# Patient Record
Sex: Male | Born: 1990
Health system: Southern US, Community
[De-identification: ages and names within clinical notes are randomized; demographics above are authoritative.]

## PROBLEM LIST (undated history)

## (undated) DIAGNOSIS — D66 Hereditary factor VIII deficiency: Secondary | ICD-10-CM

## (undated) DIAGNOSIS — Z862 Personal history of diseases of the blood and blood-forming organs and certain disorders involving the immune mechanism: Secondary | ICD-10-CM

## (undated) DIAGNOSIS — Z8489 Family history of other specified conditions: Secondary | ICD-10-CM

## (undated) DIAGNOSIS — D573 Sickle-cell trait: Secondary | ICD-10-CM

## (undated) DIAGNOSIS — Z9289 Personal history of other medical treatment: Secondary | ICD-10-CM

---
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
## 2016-08-15 ENCOUNTER — Emergency Department (HOSPITAL_COMMUNITY): Payer: No Typology Code available for payment source

## 2016-08-15 ENCOUNTER — Inpatient Hospital Stay (HOSPITAL_COMMUNITY)
Admission: EM | Admit: 2016-08-15 | Discharge: 2016-08-25 | DRG: 958 | Disposition: A | Discharge status: Home / Self Care | Payer: No Typology Code available for payment source | Attending: General Surgery | Admitting: General Surgery

## 2016-08-15 DIAGNOSIS — Z9289 Personal history of other medical treatment: Secondary | ICD-10-CM

## 2016-08-15 DIAGNOSIS — S27321A Contusion of lung, unilateral, initial encounter: Secondary | ICD-10-CM | POA: Diagnosis present

## 2016-08-15 DIAGNOSIS — T8130XA Disruption of wound, unspecified, initial encounter: Secondary | ICD-10-CM | POA: Diagnosis present

## 2016-08-15 DIAGNOSIS — Z09 Encounter for follow-up examination after completed treatment for conditions other than malignant neoplasm: Secondary | ICD-10-CM

## 2016-08-15 DIAGNOSIS — J939 Pneumothorax, unspecified: Secondary | ICD-10-CM | POA: Diagnosis present

## 2016-08-15 DIAGNOSIS — S31823A Puncture wound without foreign body of left buttock, initial encounter: Secondary | ICD-10-CM | POA: Diagnosis not present

## 2016-08-15 DIAGNOSIS — D62 Acute posthemorrhagic anemia: Secondary | ICD-10-CM | POA: Diagnosis not present

## 2016-08-15 DIAGNOSIS — S32302A Unspecified fracture of left ilium, initial encounter for closed fracture: Secondary | ICD-10-CM | POA: Diagnosis present

## 2016-08-15 DIAGNOSIS — R509 Fever, unspecified: Secondary | ICD-10-CM

## 2016-08-15 DIAGNOSIS — S31139A Puncture wound of abdominal wall without foreign body, unspecified quadrant without penetration into peritoneal cavity, initial encounter: Principal | ICD-10-CM | POA: Diagnosis present

## 2016-08-15 DIAGNOSIS — E876 Hypokalemia: Secondary | ICD-10-CM | POA: Diagnosis not present

## 2016-08-15 DIAGNOSIS — F419 Anxiety disorder, unspecified: Secondary | ICD-10-CM | POA: Diagnosis present

## 2016-08-15 DIAGNOSIS — S271XXA Traumatic hemothorax, initial encounter: Secondary | ICD-10-CM | POA: Diagnosis present

## 2016-08-15 DIAGNOSIS — W3400XA Accidental discharge from unspecified firearms or gun, initial encounter: Secondary | ICD-10-CM

## 2016-08-15 HISTORY — DX: Personal history of other medical treatment: Z92.89

## 2016-08-15 HISTORY — DX: Hereditary factor VIII deficiency: D66

## 2016-08-15 HISTORY — DX: Family history of other specified conditions: Z84.89

## 2016-08-15 HISTORY — DX: Sickle-cell trait: D57.3

## 2016-08-15 HISTORY — DX: Personal history of diseases of the blood and blood-forming organs and certain disorders involving the immune mechanism: Z86.2

## 2016-08-15 LAB — CBC
HCT: 36.4 % — ABNORMAL LOW (ref 39.0–52.0)
Hemoglobin: 12.3 g/dL — ABNORMAL LOW (ref 13.0–17.0)
MCH: 33.4 pg (ref 26.0–34.0)
MCHC: 33.8 g/dL (ref 30.0–36.0)
MCV: 98.9 fL (ref 78.0–100.0)
Platelets: 199 10*3/uL (ref 150–400)
RBC: 3.68 MIL/uL — AB (ref 4.22–5.81)
RDW: 13.3 % (ref 11.5–15.5)
WBC: 16.3 10*3/uL — AB (ref 4.0–10.5)

## 2016-08-15 LAB — PROTIME-INR
INR: 1
PROTHROMBIN TIME: 13.2 s (ref 11.4–15.2)

## 2016-08-15 LAB — COMPREHENSIVE METABOLIC PANEL
ALK PHOS: 39 U/L (ref 38–126)
ALT: 11 U/L — ABNORMAL LOW (ref 17–63)
AST: 20 U/L (ref 15–41)
Albumin: 3.3 g/dL — ABNORMAL LOW (ref 3.5–5.0)
Anion gap: 9 (ref 5–15)
BILIRUBIN TOTAL: 0.7 mg/dL (ref 0.3–1.2)
BUN: 13 mg/dL (ref 6–20)
CALCIUM: 8.2 mg/dL — AB (ref 8.9–10.3)
CO2: 20 mmol/L — ABNORMAL LOW (ref 22–32)
Chloride: 111 mmol/L (ref 101–111)
Creatinine, Ser: 1.22 mg/dL (ref 0.61–1.24)
GLUCOSE: 155 mg/dL — AB (ref 65–99)
POTASSIUM: 3.3 mmol/L — AB (ref 3.5–5.1)
Sodium: 140 mmol/L (ref 135–145)
TOTAL PROTEIN: 4.9 g/dL — AB (ref 6.5–8.1)

## 2016-08-15 LAB — I-STAT CG4 LACTIC ACID, ED: LACTIC ACID, VENOUS: 1.99 mmol/L — AB (ref 0.5–1.9)

## 2016-08-15 LAB — I-STAT CHEM 8, ED
BUN: 14 mg/dL (ref 6–20)
CHLORIDE: 110 mmol/L (ref 101–111)
CREATININE: 1.1 mg/dL (ref 0.61–1.24)
Calcium, Ion: 1.02 mmol/L — ABNORMAL LOW (ref 1.15–1.40)
GLUCOSE: 156 mg/dL — AB (ref 65–99)
HCT: 35 % — ABNORMAL LOW (ref 39.0–52.0)
Hemoglobin: 11.9 g/dL — ABNORMAL LOW (ref 13.0–17.0)
Potassium: 3 mmol/L — ABNORMAL LOW (ref 3.5–5.1)
Sodium: 143 mmol/L (ref 135–145)
TCO2: 22 mmol/L (ref 0–100)

## 2016-08-15 LAB — ETHANOL

## 2016-08-15 MED ORDER — FENTANYL CITRATE (PF) 100 MCG/2ML IJ SOLN
50.0000 ug | Freq: Once | INTRAMUSCULAR | Status: AC
  Administered 2016-08-15: 50 ug via INTRAVENOUS

## 2016-08-15 MED ORDER — IOPAMIDOL (ISOVUE-300) INJECTION 61%
INTRAVENOUS | Status: AC
  Filled 2016-08-15: qty 100

## 2016-08-15 NOTE — ED Notes (Signed)
Preparing to place L chest tube
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
## 2016-08-15 NOTE — ED Notes (Signed)
500cc EBL during chest tube placement

## 2016-08-16 ENCOUNTER — Inpatient Hospital Stay (HOSPITAL_COMMUNITY): Payer: No Typology Code available for payment source

## 2016-08-16 ENCOUNTER — Emergency Department (HOSPITAL_COMMUNITY): Payer: No Typology Code available for payment source | Admitting: Anesthesiology

## 2016-08-16 ENCOUNTER — Encounter (HOSPITAL_COMMUNITY): Admission: EM | Disposition: A | Discharge status: Home / Self Care | Payer: Self-pay | Source: Home / Self Care

## 2016-08-16 ENCOUNTER — Encounter (HOSPITAL_COMMUNITY): Payer: Self-pay | Admitting: Emergency Medicine

## 2016-08-16 DIAGNOSIS — T8130XA Disruption of wound, unspecified, initial encounter: Secondary | ICD-10-CM | POA: Diagnosis present

## 2016-08-16 DIAGNOSIS — S32302A Unspecified fracture of left ilium, initial encounter for closed fracture: Secondary | ICD-10-CM | POA: Diagnosis present

## 2016-08-16 DIAGNOSIS — S271XXA Traumatic hemothorax, initial encounter: Secondary | ICD-10-CM | POA: Diagnosis present

## 2016-08-16 DIAGNOSIS — S31139A Puncture wound of abdominal wall without foreign body, unspecified quadrant without penetration into peritoneal cavity, initial encounter: Secondary | ICD-10-CM | POA: Diagnosis present

## 2016-08-16 DIAGNOSIS — J939 Pneumothorax, unspecified: Secondary | ICD-10-CM | POA: Diagnosis present

## 2016-08-16 DIAGNOSIS — S31823A Puncture wound without foreign body of left buttock, initial encounter: Secondary | ICD-10-CM | POA: Diagnosis present

## 2016-08-16 DIAGNOSIS — F419 Anxiety disorder, unspecified: Secondary | ICD-10-CM | POA: Diagnosis present

## 2016-08-16 DIAGNOSIS — W3400XA Accidental discharge from unspecified firearms or gun, initial encounter: Secondary | ICD-10-CM

## 2016-08-16 DIAGNOSIS — E876 Hypokalemia: Secondary | ICD-10-CM | POA: Diagnosis not present

## 2016-08-16 DIAGNOSIS — D62 Acute posthemorrhagic anemia: Secondary | ICD-10-CM | POA: Diagnosis not present

## 2016-08-16 DIAGNOSIS — S27321A Contusion of lung, unilateral, initial encounter: Secondary | ICD-10-CM | POA: Diagnosis present

## 2016-08-16 HISTORY — PX: LAPAROTOMY: SHX154

## 2016-08-16 LAB — CBC
HCT: 31.3 % — ABNORMAL LOW (ref 39.0–52.0)
HEMATOCRIT: 37 % — AB (ref 39.0–52.0)
Hemoglobin: 12.3 g/dL — ABNORMAL LOW (ref 13.0–17.0)
Hemoglobin: 10.7 g/dL — ABNORMAL LOW (ref 13.0–17.0)
MCH: 32.8 pg (ref 26.0–34.0)
MCH: 33 pg (ref 26.0–34.0)
MCHC: 33.2 g/dL (ref 30.0–36.0)
MCHC: 34.2 g/dL (ref 30.0–36.0)
MCV: 98.7 fL (ref 78.0–100.0)
MCV: 96.6 fL (ref 78.0–100.0)
PLATELETS: 196 10*3/uL (ref 150–400)
Platelets: 207 10*3/uL (ref 150–400)
RBC: 3.75 MIL/uL — ABNORMAL LOW (ref 4.22–5.81)
RBC: 3.24 MIL/uL — ABNORMAL LOW (ref 4.22–5.81)
RDW: 13.5 % (ref 11.5–15.5)
RDW: 13.4 % (ref 11.5–15.5)
WBC: 16.9 10*3/uL — ABNORMAL HIGH (ref 4.0–10.5)
WBC: 21 10*3/uL — ABNORMAL HIGH (ref 4.0–10.5)

## 2016-08-16 LAB — BLOOD GAS, ARTERIAL
Acid-base deficit: 3.2 mmol/L — ABNORMAL HIGH (ref 0.0–2.0)
Bicarbonate: 21.4 mmol/L (ref 20.0–28.0)
Drawn by: 330991
FIO2: 40
LHR: 18 {breaths}/min
O2 Saturation: 97.1 %
PEEP: 5 cmH2O
Patient temperature: 98.6
VT: 570 mL
pCO2 arterial: 39.5 mmHg (ref 32.0–48.0)
pH, Arterial: 7.353 (ref 7.350–7.450)
pO2, Arterial: 98.6 mmHg (ref 83.0–108.0)

## 2016-08-16 LAB — URINALYSIS, ROUTINE W REFLEX MICROSCOPIC
BILIRUBIN URINE: NEGATIVE
GLUCOSE, UA: NEGATIVE mg/dL
HGB URINE DIPSTICK: NEGATIVE
Ketones, ur: NEGATIVE mg/dL
Leukocytes, UA: NEGATIVE
Nitrite: NEGATIVE
PH: 5 (ref 5.0–8.0)
Protein, ur: NEGATIVE mg/dL
SPECIFIC GRAVITY, URINE: 1.033 — AB (ref 1.005–1.030)

## 2016-08-16 LAB — PROTIME-INR
INR: 1.16
Prothrombin Time: 14.9 seconds (ref 11.4–15.2)

## 2016-08-16 LAB — BASIC METABOLIC PANEL
Anion gap: 5 (ref 5–15)
Anion gap: 5 (ref 5–15)
BUN: 12 mg/dL (ref 6–20)
BUN: 12 mg/dL (ref 6–20)
CALCIUM: 7.4 mg/dL — AB (ref 8.9–10.3)
CALCIUM: 7.6 mg/dL — AB (ref 8.9–10.3)
CO2: 21 mmol/L — AB (ref 22–32)
CO2: 23 mmol/L (ref 22–32)
CREATININE: 1.09 mg/dL (ref 0.61–1.24)
CREATININE: 1.03 mg/dL (ref 0.61–1.24)
Chloride: 114 mmol/L — ABNORMAL HIGH (ref 101–111)
Chloride: 111 mmol/L (ref 101–111)
GFR calc non Af Amer: >60 mL/min (ref >60)
GFR calc non Af Amer: >60 mL/min (ref >60)
GLUCOSE: 137 mg/dL — AB (ref 65–99)
Glucose, Bld: 185 mg/dL — ABNORMAL HIGH (ref 65–99)
Potassium: 3.6 mmol/L (ref 3.5–5.1)
Potassium: 4 mmol/L (ref 3.5–5.1)
SODIUM: 139 mmol/L (ref 135–145)
Sodium: 140 mmol/L (ref 135–145)

## 2016-08-16 LAB — BPAM FFP
BLOOD PRODUCT EXPIRATION DATE: 201806302359
ISSUE DATE / TIME: 201806102300
UNIT TYPE AND RH: 600

## 2016-08-16 LAB — POCT I-STAT 4, (NA,K, GLUC, HGB,HCT)
GLUCOSE: 110 mg/dL — AB (ref 65–99)
HCT: 29 % — ABNORMAL LOW (ref 39.0–52.0)
HEMOGLOBIN: 9.9 g/dL — AB (ref 13.0–17.0)
POTASSIUM: 3.7 mmol/L (ref 3.5–5.1)
Sodium: 146 mmol/L — ABNORMAL HIGH (ref 135–145)

## 2016-08-16 LAB — CDS SEROLOGY

## 2016-08-16 LAB — ABO/RH: ABO/RH(D): O POS

## 2016-08-16 LAB — TYPE AND SCREEN
ABO/RH(D): O POS
ANTIBODY SCREEN: NEGATIVE
UNIT DIVISION: 0
Unit division: 0

## 2016-08-16 LAB — BPAM RBC
BLOOD PRODUCT EXPIRATION DATE: 201807072359
Blood Product Expiration Date: 201807102359
ISSUE DATE / TIME: 201806102300
ISSUE DATE / TIME: 201806102300
Unit Type and Rh: 9500
Unit Type and Rh: 9500

## 2016-08-16 LAB — BLOOD PRODUCT ORDER (VERBAL) VERIFICATION

## 2016-08-16 LAB — PREPARE FRESH FROZEN PLASMA: UNIT DIVISION: 0

## 2016-08-16 LAB — HIV ANTIBODY (ROUTINE TESTING W REFLEX): HIV Screen 4th Generation wRfx: NONREACTIVE

## 2016-08-16 LAB — TRIGLYCERIDES: Triglycerides: 108 mg/dL (ref <150)

## 2016-08-16 LAB — MRSA PCR SCREENING: MRSA by PCR: NEGATIVE

## 2016-08-16 SURGERY — LAPAROTOMY, EXPLORATORY
Anesthesia: General | Site: Abdomen

## 2016-08-16 MED ORDER — LACTATED RINGERS IV SOLN
INTRAVENOUS | Status: DC | PRN
  Administered 2016-08-16 (×4): via INTRAVENOUS

## 2016-08-16 MED ORDER — DEXTROSE 5 % IV SOLN
2.0000 g | Freq: Once | INTRAVENOUS | Status: AC
  Administered 2016-08-16: 2 g via INTRAVENOUS
  Filled 2016-08-16: qty 2

## 2016-08-16 MED ORDER — ORAL CARE MOUTH RINSE
15.0000 mL | OROMUCOSAL | Status: DC
  Administered 2016-08-16 (×3): 15 mL via OROMUCOSAL

## 2016-08-16 MED ORDER — 0.9 % SODIUM CHLORIDE (POUR BTL) OPTIME
TOPICAL | Status: DC | PRN
  Administered 2016-08-16: 1000 mL

## 2016-08-16 MED ORDER — PROPOFOL 1000 MG/100ML IV EMUL
INTRAVENOUS | Status: AC
  Filled 2016-08-16: qty 100

## 2016-08-16 MED ORDER — HYDROMORPHONE HCL 1 MG/ML IJ SOLN
1.0000 mg | INTRAMUSCULAR | Status: DC | PRN
  Administered 2016-08-16 – 2016-08-20 (×17): 1 mg via INTRAVENOUS
  Filled 2016-08-16 (×18): qty 1

## 2016-08-16 MED ORDER — METHOCARBAMOL 1000 MG/10ML IJ SOLN
500.0000 mg | Freq: Three times a day (TID) | INTRAMUSCULAR | Status: DC | PRN
  Administered 2016-08-18 – 2016-08-20 (×4): 500 mg via INTRAVENOUS
  Filled 2016-08-16 (×5): qty 5

## 2016-08-16 MED ORDER — FENTANYL CITRATE (PF) 100 MCG/2ML IJ SOLN
INTRAMUSCULAR | Status: AC
  Filled 2016-08-16: qty 2

## 2016-08-16 MED ORDER — FENTANYL CITRATE (PF) 100 MCG/2ML IJ SOLN: 50.0000 ug | Freq: Once | INTRAMUSCULAR | Status: DC

## 2016-08-16 MED ORDER — SUCCINYLCHOLINE CHLORIDE 20 MG/ML IJ SOLN
INTRAMUSCULAR | Status: DC | PRN
  Administered 2016-08-16: 80 mg via INTRAVENOUS

## 2016-08-16 MED ORDER — FENTANYL 2500MCG IN NS 250ML (10MCG/ML) PREMIX INFUSION
25.0000 ug/h | INTRAVENOUS | Status: DC
  Administered 2016-08-16: 100 ug/h via INTRAVENOUS
  Administered 2016-08-16: 30 ug/h via INTRAVENOUS
  Filled 2016-08-16 (×3): qty 250

## 2016-08-16 MED ORDER — ACETAMINOPHEN 10 MG/ML IV SOLN
1000.0000 mg | Freq: Three times a day (TID) | INTRAVENOUS | Status: AC
  Administered 2016-08-16 – 2016-08-17 (×3): 1000 mg via INTRAVENOUS
  Filled 2016-08-16 (×3): qty 100

## 2016-08-16 MED ORDER — FENTANYL CITRATE (PF) 250 MCG/5ML IJ SOLN
INTRAMUSCULAR | Status: DC | PRN
  Administered 2016-08-16: 50 ug via INTRAVENOUS
  Administered 2016-08-16 (×2): 100 ug via INTRAVENOUS

## 2016-08-16 MED ORDER — ONDANSETRON HCL 4 MG PO TABS: 4.0000 mg | ORAL_TABLET | Freq: Four times a day (QID) | ORAL | Status: DC | PRN

## 2016-08-16 MED ORDER — PROPOFOL 1000 MG/100ML IV EMUL
0.0000 ug/kg/min | INTRAVENOUS | Status: DC
  Administered 2016-08-16: 15 ug/kg/min via INTRAVENOUS

## 2016-08-16 MED ORDER — PROPOFOL 500 MG/50ML IV EMUL
INTRAVENOUS | Status: DC | PRN
  Administered 2016-08-16: 50 ug/kg/min via INTRAVENOUS

## 2016-08-16 MED ORDER — ROCURONIUM BROMIDE 100 MG/10ML IV SOLN
INTRAVENOUS | Status: DC | PRN
  Administered 2016-08-16 (×2): 50 mg via INTRAVENOUS

## 2016-08-16 MED ORDER — PROPOFOL 10 MG/ML IV BOLUS
INTRAVENOUS | Status: DC | PRN
  Administered 2016-08-16: 120 mg via INTRAVENOUS

## 2016-08-16 MED ORDER — ONDANSETRON HCL 4 MG/2ML IJ SOLN
4.0000 mg | Freq: Four times a day (QID) | INTRAMUSCULAR | Status: DC | PRN
  Administered 2016-08-17: 4 mg via INTRAVENOUS
  Filled 2016-08-16: qty 2

## 2016-08-16 MED ORDER — FENTANYL CITRATE (PF) 250 MCG/5ML IJ SOLN
INTRAMUSCULAR | Status: AC
  Filled 2016-08-16: qty 5

## 2016-08-16 MED ORDER — KETOROLAC TROMETHAMINE 30 MG/ML IJ SOLN
30.0000 mg | Freq: Three times a day (TID) | INTRAMUSCULAR | Status: AC
  Administered 2016-08-16 – 2016-08-18 (×6): 30 mg via INTRAVENOUS
  Filled 2016-08-16 (×6): qty 1

## 2016-08-16 MED ORDER — FENTANYL BOLUS VIA INFUSION
50.0000 ug | INTRAVENOUS | Status: DC | PRN
  Filled 2016-08-16: qty 50

## 2016-08-16 MED ORDER — PANTOPRAZOLE SODIUM 40 MG IV SOLR
40.0000 mg | Freq: Every day | INTRAVENOUS | Status: DC
  Administered 2016-08-16 – 2016-08-20 (×5): 40 mg via INTRAVENOUS
  Filled 2016-08-16 (×5): qty 40

## 2016-08-16 MED ORDER — KCL IN DEXTROSE-NACL 20-5-0.45 MEQ/L-%-% IV SOLN
INTRAVENOUS | Status: DC
  Administered 2016-08-16 – 2016-08-19 (×8): via INTRAVENOUS
  Filled 2016-08-16 (×9): qty 1000

## 2016-08-16 MED ORDER — CHLORHEXIDINE GLUCONATE 0.12% ORAL RINSE (MEDLINE KIT)
15.0000 mL | Freq: Two times a day (BID) | OROMUCOSAL | Status: DC
  Administered 2016-08-16 – 2016-08-22 (×9): 15 mL via OROMUCOSAL

## 2016-08-16 MED ORDER — PANTOPRAZOLE SODIUM 40 MG PO TBEC
40.0000 mg | DELAYED_RELEASE_TABLET | Freq: Every day | ORAL | Status: DC
  Administered 2016-08-22 – 2016-08-24 (×3): 40 mg via ORAL
  Filled 2016-08-16 (×6): qty 1

## 2016-08-16 SURGICAL SUPPLY — 52 items
BLADE CLIPPER SURG (BLADE) ×3 IMPLANT
BNDG GAUZE ELAST 4 BULKY (GAUZE/BANDAGES/DRESSINGS) ×3 IMPLANT
CANISTER SUCT 3000ML PPV (MISCELLANEOUS) ×3 IMPLANT
CHLORAPREP W/TINT 26ML (MISCELLANEOUS) ×3 IMPLANT
COVER SURGICAL LIGHT HANDLE (MISCELLANEOUS) ×3 IMPLANT
DRAPE LAPAROSCOPIC ABDOMINAL (DRAPES) ×3 IMPLANT
DRAPE WARM FLUID 44X44 (DRAPE) ×3 IMPLANT
DRSG OPSITE POSTOP 4X10 (GAUZE/BANDAGES/DRESSINGS) IMPLANT
DRSG OPSITE POSTOP 4X8 (GAUZE/BANDAGES/DRESSINGS) IMPLANT
DRSG PAD ABDOMINAL 8X10 ST (GAUZE/BANDAGES/DRESSINGS) ×3 IMPLANT
ELECT BLADE 6.5 EXT (BLADE) IMPLANT
ELECT CAUTERY BLADE 6.4 (BLADE) ×3 IMPLANT
ELECT REM PT RETURN 9FT ADLT (ELECTROSURGICAL) ×3
ELECTRODE REM PT RTRN 9FT ADLT (ELECTROSURGICAL) ×1 IMPLANT
GLOVE BIO SURGEON STRL SZ8 (GLOVE) ×6 IMPLANT
GLOVE BIOGEL PI IND STRL 7.0 (GLOVE) ×1 IMPLANT
GLOVE BIOGEL PI IND STRL 8 (GLOVE) ×2 IMPLANT
GLOVE BIOGEL PI INDICATOR 7.0 (GLOVE) ×2
GLOVE BIOGEL PI INDICATOR 8 (GLOVE) ×4
GLOVE SURG SS PI 7.0 STRL IVOR (GLOVE) ×3 IMPLANT
GOWN STRL REUS W/ TWL LRG LVL3 (GOWN DISPOSABLE) ×2 IMPLANT
GOWN STRL REUS W/ TWL XL LVL3 (GOWN DISPOSABLE) ×1 IMPLANT
GOWN STRL REUS W/TWL LRG LVL3 (GOWN DISPOSABLE) ×4
GOWN STRL REUS W/TWL XL LVL3 (GOWN DISPOSABLE) ×2
KIT BASIN OR (CUSTOM PROCEDURE TRAY) ×3 IMPLANT
KIT ROOM TURNOVER OR (KITS) ×3 IMPLANT
LIGASURE IMPACT 36 18CM CVD LR (INSTRUMENTS) ×6 IMPLANT
NS IRRIG 1000ML POUR BTL (IV SOLUTION) ×6 IMPLANT
PACK GENERAL/GYN (CUSTOM PROCEDURE TRAY) ×3 IMPLANT
PAD ARMBOARD 7.5X6 YLW CONV (MISCELLANEOUS) ×6 IMPLANT
RELOAD PROXIMATE 30MM BLUE (ENDOMECHANICALS) ×3 IMPLANT
RELOAD PROXIMATE 75MM BLUE (ENDOMECHANICALS) ×6 IMPLANT
RELOAD STAPLER LINEAR PROX 30 (STAPLE) ×1 IMPLANT
SPECIMEN JAR LARGE (MISCELLANEOUS) ×3 IMPLANT
SPONGE LAP 18X18 X RAY DECT (DISPOSABLE) ×21 IMPLANT
STAPLER GUN LINEAR PROX 60 (STAPLE) ×3 IMPLANT
STAPLER PROXIMATE 75MM BLUE (STAPLE) ×6 IMPLANT
STAPLER RELOAD LINEAR PROX 30 (STAPLE) ×3
STAPLER VISISTAT 35W (STAPLE) ×3 IMPLANT
SUCTION POOLE TIP (SUCTIONS) ×3 IMPLANT
SUT PDS AB 1 TP1 96 (SUTURE) ×6 IMPLANT
SUT SILK 2 0 SH CR/8 (SUTURE) ×6 IMPLANT
SUT SILK 2 0 TIES 10X30 (SUTURE) ×3 IMPLANT
SUT SILK 3 0 SH CR/8 (SUTURE) ×6 IMPLANT
SUT SILK 3 0 TIES 10X30 (SUTURE) ×3 IMPLANT
SUT VIC AB 2-0 SH 18 (SUTURE) ×3 IMPLANT
SUT VIC AB 3-0 SH 18 (SUTURE) ×3 IMPLANT
TAPE CLOTH SURG 4X10 WHT LF (GAUZE/BANDAGES/DRESSINGS) ×3 IMPLANT
TOWEL GREEN STERILE (TOWEL DISPOSABLE) ×3 IMPLANT
TOWEL OR 17X26 10 PK STRL BLUE (TOWEL DISPOSABLE) ×3 IMPLANT
TRAY FOLEY W/METER SILVER 16FR (SET/KITS/TRAYS/PACK) ×3 IMPLANT
YANKAUER SUCT BULB TIP NO VENT (SUCTIONS) ×3 IMPLANT
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
## 2016-08-16 NOTE — Progress Notes (Signed)
Patient extubated and doing well,
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
## 2016-08-16 NOTE — Procedures (Signed)
Extubation Procedure Note  Patient Details:   Name: William Harrison DOB: June 06, 1990 MRN: [id number]   Airway Documentation:     Evaluation  O2 sats: stable throughout Complications: No apparent complications Patient did tolerate procedure well. Bilateral Breath Sounds: Clear   Yes   Patient extubated to 5L nasal cannula.  Positive cuff leak noted.  No evidence of stridor.  Patient able to speak post extubation.  Sats currently 95%.  Vitals are stable.  No complications noted.   William Harrison, [name] N 08/16/2016, 10:47 AM

## 2016-08-16 NOTE — Progress Notes (Signed)
Patient weaning, rate reduced on propofol and fentanyl. Patient taking good deep breaths. Very anxious for tube to be removed. RN has spoken with Dr. Lindie SpruceWyatt and he will be rounding on patient.

## 2016-08-16 NOTE — Transfer of Care (Signed)
Immediate Anesthesia Transfer of Care Note  Patient: William Harrison  Procedure(s) Performed: Procedure(s): EXPLORATORY LAPAROTOMY (N/A)  Patient Location: ICU  Anesthesia Type:General  Level of Consciousness: Patient remains intubated per anesthesia plan  Airway & Oxygen Therapy: Patient remains intubated per anesthesia plan and Patient placed on Ventilator (see vital sign flow sheet for setting)  Post-op Assessment: Report given to RN and Post -op Vital signs reviewed and stable  Post vital signs: Reviewed and stable  Last Vitals:  Vitals:   08/15/16 2313 08/15/16 2320  BP: (!) 130/53 131/66  Pulse: (!) 56 73  Resp: 16 (!) 24  Temp:      Last Pain:  Vitals:   08/15/16 2255  TempSrc: Tympanic         Complications: No apparent anesthesia complications
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
## 2016-08-16 NOTE — Anesthesia Preprocedure Evaluation (Addendum)
Anesthesia Evaluation  Patient identified by MRN, date of birth, ID band Patient awake    Reviewed: Allergy & Precautions, NPO status , Patient's Chart, lab work & pertinent test resultsPreop documentation limited or incomplete due to emergent nature of procedure.  Airway Mallampati: II  TM Distance: <3 FB Neck ROM: Full    Dental  (+) Teeth Intact, Dental Advisory Given   Pulmonary Current Smoker,     + decreased breath sounds      Cardiovascular  Rhythm:Regular Rate:Tachycardia     Neuro/Psych    GI/Hepatic   Endo/Other    Renal/GU      Musculoskeletal   Abdominal   Peds  Hematology  (+) Sickle cell trait ,   Anesthesia Other Findings   Reproductive/Obstetrics                            Anesthesia Physical Anesthesia Plan  ASA: II and emergent  Anesthesia Plan: General   Post-op Pain Management:    Induction: Intravenous, Rapid sequence and Cricoid pressure planned  PONV Risk Score and Plan: Treatment may vary due to age or medical condition  Airway Management Planned: Oral ETT  Additional Equipment:   Intra-op Plan:   Post-operative Plan: Post-operative intubation/ventilation  Informed Consent: I have reviewed the patients History and Physical, chart, labs and discussed the procedure including the risks, benefits and alternatives for the proposed anesthesia with the patient or authorized representative who has indicated his/her understanding and acceptance.   Only emergency history available and Dental advisory given  Plan Discussed with: CRNA, Anesthesiologist and Surgeon  Anesthesia Plan Comments:        Anesthesia Quick Evaluation

## 2016-08-16 NOTE — Anesthesia Procedure Notes (Signed)
Procedure Name: Intubation Date/Time: 08/16/2016 12:37 AM Performed by: Brien MatesMAHONY, Anatole Apollo D Pre-anesthesia Checklist: Patient identified, Emergency Drugs available, Suction available, Patient being monitored and Timeout performed Patient Re-evaluated:Patient Re-evaluated prior to inductionOxygen Delivery Method: Circle system utilized Preoxygenation: Pre-oxygenation with 100% oxygen Intubation Type: IV induction, Rapid sequence and Cricoid Pressure applied Laryngoscope Size: Miller and 2 Grade View: Grade I Tube type: Subglottic suction tube Tube size: 8.0 mm Number of attempts: 1 Airway Equipment and Method: Stylet Placement Confirmation: ETT inserted through vocal cords under direct vision,  positive ETCO2 and breath sounds checked- equal and bilateral Secured at: 22 cm Tube secured with: Tape Dental Injury: Teeth and Oropharynx as per pre-operative assessment
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
## 2016-08-17 ENCOUNTER — Inpatient Hospital Stay (HOSPITAL_COMMUNITY): Payer: No Typology Code available for payment source

## 2016-08-17 ENCOUNTER — Encounter (HOSPITAL_COMMUNITY): Payer: Self-pay

## 2016-08-17 LAB — BASIC METABOLIC PANEL
ANION GAP: 4 — AB (ref 5–15)
BUN: 7 mg/dL (ref 6–20)
CHLORIDE: 107 mmol/L (ref 101–111)
CO2: 24 mmol/L (ref 22–32)
Calcium: 8.1 mg/dL — ABNORMAL LOW (ref 8.9–10.3)
Creatinine, Ser: 0.87 mg/dL (ref 0.61–1.24)
GFR calc non Af Amer: >60 mL/min (ref >60)
Glucose, Bld: 114 mg/dL — ABNORMAL HIGH (ref 65–99)
POTASSIUM: 3.6 mmol/L (ref 3.5–5.1)
Sodium: 135 mmol/L (ref 135–145)

## 2016-08-17 LAB — CBC
HEMATOCRIT: 25.6 % — AB (ref 39.0–52.0)
HEMOGLOBIN: 9 g/dL — AB (ref 13.0–17.0)
MCH: 33.3 pg (ref 26.0–34.0)
MCHC: 35.2 g/dL (ref 30.0–36.0)
MCV: 94.8 fL (ref 78.0–100.0)
Platelets: 139 10*3/uL — ABNORMAL LOW (ref 150–400)
RBC: 2.7 MIL/uL — AB (ref 4.22–5.81)
RDW: 13.2 % (ref 11.5–15.5)
WBC: 14.2 10*3/uL — AB (ref 4.0–10.5)

## 2016-08-17 MED ORDER — SODIUM CHLORIDE 0.9 % IV SOLN
25.0000 mg | Freq: Four times a day (QID) | INTRAVENOUS | Status: DC | PRN
  Filled 2016-08-17 (×2): qty 1

## 2016-08-17 MED ORDER — LORAZEPAM BOLUS VIA INFUSION: 1.0000 mg | Freq: Four times a day (QID) | INTRAVENOUS | Status: DC | PRN

## 2016-08-17 MED ORDER — CHLORHEXIDINE GLUCONATE 0.12 % MT SOLN
OROMUCOSAL | Status: AC
  Filled 2016-08-17: qty 15

## 2016-08-17 MED ORDER — LORAZEPAM 2 MG/ML IJ SOLN: 1.0000 mg | Freq: Four times a day (QID) | INTRAMUSCULAR | Status: DC | PRN
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
## 2016-08-17 NOTE — Anesthesia Postprocedure Evaluation (Signed)
Anesthesia Post Note  Patient: William Harrison  Procedure(s) Performed: Procedure(s) (LRB): EXPLORATORY LAPAROTOMY (N/A)     Patient location during evaluation: SICU Anesthesia Type: General Level of consciousness: sedated Pain management: pain level controlled Vital Signs Assessment: post-procedure vital signs reviewed and stable Respiratory status: patient remains intubated per anesthesia plan Cardiovascular status: stable Anesthetic complications: no    Last Vitals:  Vitals:   08/17/16 0600 08/17/16 0700  BP: (!) 145/77 139/73  Pulse: 91 67  Resp: 18 14  Temp:  37.2 C    Last Pain:  Vitals:   08/17/16 0700  TempSrc: Oral  PainSc:                  Nylani Michetti
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
## 2016-08-17 NOTE — Progress Notes (Signed)
Patient arrived to 6n24, alert and oriented, NG tube placed to suction, chest tube noted in place and hooked to wall suction, IV fluids infusing, patient reported moderate pain will give medicine according to orders. VSS, not on any oxygen, has incentive spirometer at bedside. Set up password for patient and explained to him how the password works. Mother at bedside, oriented to room and staff, will continue to monitor.

## 2016-08-18 ENCOUNTER — Inpatient Hospital Stay (HOSPITAL_COMMUNITY): Payer: No Typology Code available for payment source | Admitting: Anesthesiology

## 2016-08-18 ENCOUNTER — Inpatient Hospital Stay (HOSPITAL_COMMUNITY): Payer: No Typology Code available for payment source

## 2016-08-18 ENCOUNTER — Encounter (HOSPITAL_COMMUNITY): Payer: Self-pay | Admitting: Anesthesiology

## 2016-08-18 ENCOUNTER — Encounter (HOSPITAL_COMMUNITY): Admission: EM | Disposition: A | Discharge status: Home / Self Care | Payer: Self-pay | Source: Home / Self Care

## 2016-08-18 HISTORY — PX: ABDOMINAL WOUND DEHISCENCE: SHX540

## 2016-08-18 LAB — BASIC METABOLIC PANEL
Anion gap: 7 (ref 5–15)
BUN: 6 mg/dL (ref 6–20)
CHLORIDE: 105 mmol/L (ref 101–111)
CO2: 24 mmol/L (ref 22–32)
Calcium: 7.6 mg/dL — ABNORMAL LOW (ref 8.9–10.3)
Creatinine, Ser: 0.98 mg/dL (ref 0.61–1.24)
GFR calc Af Amer: >60 mL/min (ref >60)
GLUCOSE: 109 mg/dL — AB (ref 65–99)
POTASSIUM: 3.5 mmol/L (ref 3.5–5.1)
SODIUM: 136 mmol/L (ref 135–145)

## 2016-08-18 LAB — CBC
HCT: 22.6 % — ABNORMAL LOW (ref 39.0–52.0)
Hemoglobin: 7.9 g/dL — ABNORMAL LOW (ref 13.0–17.0)
MCH: 33.2 pg (ref 26.0–34.0)
MCHC: 35 g/dL (ref 30.0–36.0)
MCV: 95 fL (ref 78.0–100.0)
Platelets: 149 10*3/uL — ABNORMAL LOW (ref 150–400)
RBC: 2.38 MIL/uL — ABNORMAL LOW (ref 4.22–5.81)
RDW: 13.2 % (ref 11.5–15.5)
WBC: 12.3 10*3/uL — ABNORMAL HIGH (ref 4.0–10.5)

## 2016-08-18 SURGERY — REPAIR, DEHISCENCE, WOUND, ABDOMEN
Anesthesia: General

## 2016-08-18 MED ORDER — DIPHENHYDRAMINE HCL 12.5 MG/5ML PO ELIX: 12.5000 mg | ORAL_SOLUTION | Freq: Four times a day (QID) | ORAL | Status: DC | PRN

## 2016-08-18 MED ORDER — HYDROMORPHONE HCL 1 MG/ML IJ SOLN
INTRAMUSCULAR | Status: AC
  Administered 2016-08-18: 0.5 mg via INTRAVENOUS
  Filled 2016-08-18: qty 1

## 2016-08-18 MED ORDER — HYDROMORPHONE 1 MG/ML IV SOLN
INTRAVENOUS | Status: DC
  Administered 2016-08-18: 16:00:00 via INTRAVENOUS
  Administered 2016-08-18: 0.6 mg via INTRAVENOUS
  Administered 2016-08-18: 0.8 mg via INTRAVENOUS
  Administered 2016-08-19: 0.6 mg via INTRAVENOUS
  Filled 2016-08-18 (×3): qty 25

## 2016-08-18 MED ORDER — MIDAZOLAM HCL 2 MG/2ML IJ SOLN: 0.5000 mg | Freq: Once | INTRAMUSCULAR | Status: DC | PRN

## 2016-08-18 MED ORDER — ONDANSETRON HCL 4 MG/2ML IJ SOLN
INTRAMUSCULAR | Status: DC | PRN
Start: 2016-08-18 — End: 2016-08-18
  Administered 2016-08-18: 4 mg via INTRAVENOUS

## 2016-08-18 MED ORDER — MEPERIDINE HCL 25 MG/ML IJ SOLN
6.2500 mg | INTRAMUSCULAR | Status: DC | PRN
Start: 2016-08-18 — End: 2016-08-18

## 2016-08-18 MED ORDER — HYDROMORPHONE HCL 1 MG/ML IJ SOLN
INTRAMUSCULAR | Status: AC
  Filled 2016-08-18: qty 1

## 2016-08-18 MED ORDER — LACTATED RINGERS IV SOLN
INTRAVENOUS | Status: DC
  Administered 2016-08-18: 14:00:00 via INTRAVENOUS

## 2016-08-18 MED ORDER — FENTANYL CITRATE (PF) 250 MCG/5ML IJ SOLN
INTRAMUSCULAR | Status: AC
  Filled 2016-08-18: qty 5

## 2016-08-18 MED ORDER — LIDOCAINE 2% (20 MG/ML) 5 ML SYRINGE
INTRAMUSCULAR | Status: AC
  Filled 2016-08-18: qty 5

## 2016-08-18 MED ORDER — DIPHENHYDRAMINE HCL 50 MG/ML IJ SOLN: 12.5000 mg | Freq: Four times a day (QID) | INTRAMUSCULAR | Status: DC | PRN

## 2016-08-18 MED ORDER — HYDROMORPHONE HCL 1 MG/ML IJ SOLN
0.2500 mg | INTRAMUSCULAR | Status: DC | PRN
  Administered 2016-08-18 (×3): 0.5 mg via INTRAVENOUS

## 2016-08-18 MED ORDER — ONDANSETRON HCL 4 MG/2ML IJ SOLN: 4.0000 mg | Freq: Four times a day (QID) | INTRAMUSCULAR | Status: DC | PRN

## 2016-08-18 MED ORDER — SUCCINYLCHOLINE CHLORIDE 20 MG/ML IJ SOLN
INTRAMUSCULAR | Status: DC | PRN
  Administered 2016-08-18: 140 mg via INTRAVENOUS

## 2016-08-18 MED ORDER — PHENYLEPHRINE 40 MCG/ML (10ML) SYRINGE FOR IV PUSH (FOR BLOOD PRESSURE SUPPORT)
PREFILLED_SYRINGE | INTRAVENOUS | Status: DC | PRN
  Administered 2016-08-18 (×2): 120 ug via INTRAVENOUS

## 2016-08-18 MED ORDER — SUGAMMADEX SODIUM 200 MG/2ML IV SOLN
INTRAVENOUS | Status: DC | PRN
  Administered 2016-08-18: 200 mg via INTRAVENOUS

## 2016-08-18 MED ORDER — HYDROMORPHONE HCL 1 MG/ML IJ SOLN
INTRAMUSCULAR | Status: AC
  Filled 2016-08-18: qty 0.5

## 2016-08-18 MED ORDER — ROCURONIUM BROMIDE 10 MG/ML (PF) SYRINGE
PREFILLED_SYRINGE | INTRAVENOUS | Status: AC
  Filled 2016-08-18: qty 5

## 2016-08-18 MED ORDER — SUCCINYLCHOLINE CHLORIDE 200 MG/10ML IV SOSY
PREFILLED_SYRINGE | INTRAVENOUS | Status: AC
  Filled 2016-08-18: qty 10

## 2016-08-18 MED ORDER — 0.9 % SODIUM CHLORIDE (POUR BTL) OPTIME
TOPICAL | Status: DC | PRN
  Administered 2016-08-18: 1000 mL

## 2016-08-18 MED ORDER — PROPOFOL 10 MG/ML IV BOLUS: INTRAVENOUS | Status: DC | PRN

## 2016-08-18 MED ORDER — ROCURONIUM BROMIDE 100 MG/10ML IV SOLN
INTRAVENOUS | Status: DC | PRN
  Administered 2016-08-18: 40 mg via INTRAVENOUS

## 2016-08-18 MED ORDER — MIDAZOLAM HCL 5 MG/5ML IJ SOLN
INTRAMUSCULAR | Status: DC | PRN
  Administered 2016-08-18: 2 mg via INTRAVENOUS

## 2016-08-18 MED ORDER — FENTANYL CITRATE (PF) 100 MCG/2ML IJ SOLN
INTRAMUSCULAR | Status: DC | PRN
  Administered 2016-08-18 (×2): 50 ug via INTRAVENOUS
  Administered 2016-08-18: 150 ug via INTRAVENOUS

## 2016-08-18 MED ORDER — PROPOFOL 10 MG/ML IV BOLUS
INTRAVENOUS | Status: AC
  Filled 2016-08-18: qty 20

## 2016-08-18 MED ORDER — SODIUM CHLORIDE 0.9% FLUSH
9.0000 mL | INTRAVENOUS | Status: DC | PRN
Start: 2016-08-18 — End: 2016-08-19

## 2016-08-18 MED ORDER — PROMETHAZINE HCL 25 MG/ML IJ SOLN: 6.2500 mg | INTRAMUSCULAR | Status: DC | PRN

## 2016-08-18 MED ORDER — NALOXONE HCL 0.4 MG/ML IJ SOLN: 0.4000 mg | INTRAMUSCULAR | Status: DC | PRN

## 2016-08-18 MED ORDER — PROPOFOL 10 MG/ML IV BOLUS
INTRAVENOUS | Status: DC | PRN
Start: 2016-08-18 — End: 2016-08-18
  Administered 2016-08-18: 200 mg via INTRAVENOUS

## 2016-08-18 MED ORDER — LACTATED RINGERS IV SOLN
INTRAVENOUS | Status: DC | PRN
  Administered 2016-08-18 (×2): via INTRAVENOUS

## 2016-08-18 MED ORDER — MIDAZOLAM HCL 2 MG/2ML IJ SOLN
INTRAMUSCULAR | Status: AC
  Filled 2016-08-18: qty 2

## 2016-08-18 SURGICAL SUPPLY — 31 items
BNDG GAUZE ELAST 4 BULKY (GAUZE/BANDAGES/DRESSINGS) ×3 IMPLANT
CANISTER SUCT 3000ML PPV (MISCELLANEOUS) ×3 IMPLANT
COVER SURGICAL LIGHT HANDLE (MISCELLANEOUS) ×3 IMPLANT
DRAIN PENROSE 1/4X12 LTX STRL (WOUND CARE) ×3 IMPLANT
DRAPE LAPAROSCOPIC ABDOMINAL (DRAPES) IMPLANT
DRAPE LAPAROTOMY 100X72 PEDS (DRAPES) IMPLANT
DRAPE UTILITY XL STRL (DRAPES) ×6 IMPLANT
DRSG PAD ABDOMINAL 8X10 ST (GAUZE/BANDAGES/DRESSINGS) ×6 IMPLANT
DRSG TEGADERM 4X4.75 (GAUZE/BANDAGES/DRESSINGS) ×6 IMPLANT
ELECT CAUTERY BLADE 6.4 (BLADE) ×3 IMPLANT
ELECT REM PT RETURN 9FT ADLT (ELECTROSURGICAL) ×3
ELECTRODE REM PT RTRN 9FT ADLT (ELECTROSURGICAL) ×1 IMPLANT
GAUZE SPONGE 4X4 12PLY STRL (GAUZE/BANDAGES/DRESSINGS) ×6 IMPLANT
GLOVE BIOGEL PI IND STRL 8 (GLOVE) ×1 IMPLANT
GLOVE BIOGEL PI INDICATOR 8 (GLOVE) ×2
GLOVE ECLIPSE 7.5 STRL STRAW (GLOVE) ×3 IMPLANT
GOWN STRL REUS W/ TWL LRG LVL3 (GOWN DISPOSABLE) ×2 IMPLANT
GOWN STRL REUS W/TWL LRG LVL3 (GOWN DISPOSABLE) ×4
KIT BASIN OR (CUSTOM PROCEDURE TRAY) ×3 IMPLANT
KIT ROOM TURNOVER OR (KITS) ×3 IMPLANT
NS IRRIG 1000ML POUR BTL (IV SOLUTION) ×3 IMPLANT
PACK GENERAL/GYN (CUSTOM PROCEDURE TRAY) ×3 IMPLANT
PAD ARMBOARD 7.5X6 YLW CONV (MISCELLANEOUS) ×3 IMPLANT
SUT ETHILON 4 0 FS 1 (SUTURE) ×6 IMPLANT
SUT NOVA 1 T20/GS 25DT (SUTURE) ×3 IMPLANT
SWAB COLLECTION DEVICE MRSA (MISCELLANEOUS) IMPLANT
SWAB CULTURE ESWAB REG 1ML (MISCELLANEOUS) IMPLANT
SYSTEM SAHARA CHEST DRAIN ATS (WOUND CARE) ×3 IMPLANT
TAPE CLOTH SURG 6X10 WHT LF (GAUZE/BANDAGES/DRESSINGS) ×3 IMPLANT
TOWEL OR 17X24 6PK STRL BLUE (TOWEL DISPOSABLE) ×3 IMPLANT
TOWEL OR 17X26 10 PK STRL BLUE (TOWEL DISPOSABLE) ×3 IMPLANT
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
## 2016-08-18 NOTE — Progress Notes (Signed)
PT Cancellation Note  Patient Details Name: William Harrison MRN: [id number] DOB: 1990-04-15   Cancelled Treatment:    Reason Eval/Treat Not Completed: (P) Patient at procedure or test/unavailable (Pt off unit for procedure, will defer tx at this time.  )   Florestine Aversimee J Ksenia Kunz 08/18/2016, 2:13 PM  Joycelyn RuaAimee Anessa Charley, PTA pager [phone]
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
## 2016-08-18 NOTE — Plan of Care (Signed)
Problem: Pain Management: Goal: Pain level will decrease Outcome: Progressing Pain being managed with IV PRN pain medication at this time. No facial grimacing or moaning evident. Vital signs are stable.
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
## 2016-08-18 NOTE — Plan of Care (Signed)
Problem: Safety: Goal: Ability to remain free from injury will improve Outcome: Progressing No falls during this admission. Call bell within reach. Patient alert and oriented. Family at bedside. Bed in low and locked position. 3/4 siderails in place. Nonskid footwear being utilized. Clean and clear environment maintained. Patient verbalized understanding of safety instruction.

## 2016-08-18 NOTE — Anesthesia Procedure Notes (Signed)
Procedure Name: Intubation Date/Time: 08/18/2016 2:33 PM Performed by: Teressa Lower Pre-anesthesia Checklist: Patient identified, Emergency Drugs available, Suction available and Patient being monitored Patient Re-evaluated:Patient Re-evaluated prior to inductionOxygen Delivery Method: Circle system utilized Preoxygenation: Pre-oxygenation with 100% oxygen Intubation Type: IV induction, Cricoid Pressure applied and Rapid sequence Laryngoscope Size: Mac Grade View: Grade I Tube type: Oral Tube size: 7.0 mm Number of attempts: 1 Airway Equipment and Method: Stylet and Oral airway Placement Confirmation: ETT inserted through vocal cords under direct vision,  positive ETCO2 and breath sounds checked- equal and bilateral Secured at: 23 cm Tube secured with: Tape Dental Injury: Teeth and Oropharynx as per pre-operative assessment
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
## 2016-08-18 NOTE — Progress Notes (Signed)
OT Cancellation Note  Patient Details Name: William Harrison MRN: [id number] DOB: 1990/09/19   Cancelled Treatment:    Reason Eval/Treat Not Completed: Patient at procedure or test/ unavailable. Currently in OR. Will follow up as time allows.  Gaye AlkenBailey A Tyana Butzer M.S., OTR/L Pager: [phone]  08/18/2016, 1:47 PM

## 2016-08-19 ENCOUNTER — Encounter (HOSPITAL_COMMUNITY): Payer: Self-pay | Admitting: General Surgery

## 2016-08-19 ENCOUNTER — Inpatient Hospital Stay (HOSPITAL_COMMUNITY): Payer: No Typology Code available for payment source

## 2016-08-19 LAB — CBC
HEMATOCRIT: 21.2 % — AB (ref 39.0–52.0)
Hemoglobin: 7.3 g/dL — ABNORMAL LOW (ref 13.0–17.0)
MCH: 32.6 pg (ref 26.0–34.0)
MCHC: 34.4 g/dL (ref 30.0–36.0)
MCV: 94.6 fL (ref 78.0–100.0)
Platelets: 170 10*3/uL (ref 150–400)
RBC: 2.24 MIL/uL — AB (ref 4.22–5.81)
RDW: 12.7 % (ref 11.5–15.5)
WBC: 11.3 10*3/uL — AB (ref 4.0–10.5)

## 2016-08-19 LAB — BASIC METABOLIC PANEL
ANION GAP: 7 (ref 5–15)
BUN: <5 mg/dL — ABNORMAL LOW (ref 6–20)
CHLORIDE: 104 mmol/L (ref 101–111)
CO2: 25 mmol/L (ref 22–32)
CREATININE: 0.89 mg/dL (ref 0.61–1.24)
Calcium: 7.9 mg/dL — ABNORMAL LOW (ref 8.9–10.3)
GFR calc Af Amer: >60 mL/min (ref >60)
GFR calc non Af Amer: >60 mL/min (ref >60)
GLUCOSE: 110 mg/dL — AB (ref 65–99)
Potassium: 3.4 mmol/L — ABNORMAL LOW (ref 3.5–5.1)
Sodium: 136 mmol/L (ref 135–145)

## 2016-08-19 MED ORDER — KCL IN DEXTROSE-NACL 30-5-0.45 MEQ/L-%-% IV SOLN
INTRAVENOUS | Status: DC
  Administered 2016-08-19: 100 mL/h via INTRAVENOUS
  Administered 2016-08-20 – 2016-08-21 (×3): via INTRAVENOUS
  Filled 2016-08-19 (×7): qty 1000

## 2016-08-19 MED ORDER — GUAIFENESIN ER 600 MG PO TB12
600.0000 mg | ORAL_TABLET | Freq: Two times a day (BID) | ORAL | Status: DC
  Administered 2016-08-19 – 2016-08-24 (×12): 600 mg via ORAL
  Filled 2016-08-19 (×13): qty 1

## 2016-08-19 MED ORDER — ACETAMINOPHEN 10 MG/ML IV SOLN
1000.0000 mg | Freq: Three times a day (TID) | INTRAVENOUS | Status: AC
  Administered 2016-08-19 – 2016-08-20 (×3): 1000 mg via INTRAVENOUS
  Filled 2016-08-19 (×3): qty 100
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
## 2016-08-19 NOTE — Progress Notes (Signed)
Abdominal dressing changed,patients mother at bedside to observe how to change dressing.NGT clamped x 4 hrs as ordered.Dilaudid PCA 20ml wasted in sink as witnessed by Teola BradleyZee Calwitan RN.
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
## 2016-08-19 NOTE — Progress Notes (Signed)
Chest tube to water seal per order.
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
## 2016-08-20 ENCOUNTER — Inpatient Hospital Stay (HOSPITAL_COMMUNITY): Payer: No Typology Code available for payment source

## 2016-08-20 LAB — CBC
HEMATOCRIT: 21.1 % — AB (ref 39.0–52.0)
Hemoglobin: 7.3 g/dL — ABNORMAL LOW (ref 13.0–17.0)
MCH: 32.6 pg (ref 26.0–34.0)
MCHC: 34.6 g/dL (ref 30.0–36.0)
MCV: 94.2 fL (ref 78.0–100.0)
PLATELETS: 231 10*3/uL (ref 150–400)
RBC: 2.24 MIL/uL — AB (ref 4.22–5.81)
RDW: 12.7 % (ref 11.5–15.5)
WBC: 10.1 10*3/uL (ref 4.0–10.5)

## 2016-08-20 LAB — BASIC METABOLIC PANEL
Anion gap: 7 (ref 5–15)
CO2: 24 mmol/L (ref 22–32)
Calcium: 8.2 mg/dL — ABNORMAL LOW (ref 8.9–10.3)
Chloride: 103 mmol/L (ref 101–111)
Creatinine, Ser: 0.82 mg/dL (ref 0.61–1.24)
GFR calc Af Amer: >60 mL/min (ref >60)
GLUCOSE: 102 mg/dL — AB (ref 65–99)
POTASSIUM: 3.6 mmol/L (ref 3.5–5.1)
Sodium: 134 mmol/L — ABNORMAL LOW (ref 135–145)

## 2016-08-20 MED ORDER — ACETAMINOPHEN 325 MG PO TABS: 650.0000 mg | ORAL_TABLET | Freq: Four times a day (QID) | ORAL | Status: DC

## 2016-08-20 MED ORDER — ACETAMINOPHEN 325 MG PO TABS
650.0000 mg | ORAL_TABLET | Freq: Four times a day (QID) | ORAL | Status: DC
  Administered 2016-08-20 – 2016-08-25 (×19): 650 mg via ORAL
  Filled 2016-08-20 (×20): qty 2

## 2016-08-20 MED ORDER — OXYCODONE HCL 5 MG PO TABS
5.0000 mg | ORAL_TABLET | ORAL | Status: DC | PRN
  Administered 2016-08-20 – 2016-08-24 (×13): 10 mg via ORAL
  Filled 2016-08-20 (×7): qty 2
  Filled 2016-08-20: qty 1
  Filled 2016-08-20: qty 2
  Filled 2016-08-20: qty 1
  Filled 2016-08-20 (×4): qty 2

## 2016-08-20 MED ORDER — HYDROMORPHONE HCL 1 MG/ML IJ SOLN: 1.0000 mg | INTRAMUSCULAR | Status: DC | PRN
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
## 2016-08-20 NOTE — Progress Notes (Signed)
NG tube  Clamped per MD order. No nausea or vomiting noted at this time.

## 2016-08-20 NOTE — Progress Notes (Signed)
NG tube connected to LIWS per order. No nausea and vomiting noted.
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
## 2016-08-20 NOTE — Progress Notes (Addendum)
NG tube connected to LIWS per order.No nausea or vomiting noted.Will continue to monitor.

## 2016-08-20 NOTE — Progress Notes (Signed)
NG tube clamped per MD order. No nausea or vomiting noted at this time.Will continue to monitor.

## 2016-08-21 ENCOUNTER — Inpatient Hospital Stay (HOSPITAL_COMMUNITY): Payer: No Typology Code available for payment source

## 2016-08-21 MED ORDER — BISACODYL 5 MG PO TBEC
10.0000 mg | DELAYED_RELEASE_TABLET | Freq: Once | ORAL | Status: AC
  Administered 2016-08-21: 10 mg via ORAL
  Filled 2016-08-21: qty 2

## 2016-08-21 NOTE — Progress Notes (Signed)
Patient's temperature 101.0 orally. Notified Dr Derrell Lollingamirez via text page. Derrell LollingRamirez returned call. Notified of patient's temp 101 and also earlier temp of 102. Ordered chest x-ray, blood cultures x2, and urine culture.
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
## 2016-08-22 LAB — CBC
HEMATOCRIT: 22.4 % — AB (ref 39.0–52.0)
Hemoglobin: 7.6 g/dL — ABNORMAL LOW (ref 13.0–17.0)
MCH: 31.9 pg (ref 26.0–34.0)
MCHC: 33.9 g/dL (ref 30.0–36.0)
MCV: 94.1 fL (ref 78.0–100.0)
Platelets: 345 10*3/uL (ref 150–400)
RBC: 2.38 MIL/uL — AB (ref 4.22–5.81)
RDW: 13 % (ref 11.5–15.5)
WBC: 10.2 10*3/uL (ref 4.0–10.5)

## 2016-08-22 LAB — BASIC METABOLIC PANEL
ANION GAP: 9 (ref 5–15)
BUN: <5 mg/dL — ABNORMAL LOW (ref 6–20)
CO2: 25 mmol/L (ref 22–32)
Calcium: 8.6 mg/dL — ABNORMAL LOW (ref 8.9–10.3)
Chloride: 99 mmol/L — ABNORMAL LOW (ref 101–111)
Creatinine, Ser: 0.95 mg/dL (ref 0.61–1.24)
GFR calc Af Amer: >60 mL/min (ref >60)
GFR calc non Af Amer: >60 mL/min (ref >60)
GLUCOSE: 104 mg/dL — AB (ref 65–99)
POTASSIUM: 3.7 mmol/L (ref 3.5–5.1)
Sodium: 133 mmol/L — ABNORMAL LOW (ref 135–145)

## 2016-08-22 MED ORDER — METHOCARBAMOL 500 MG PO TABS: 500.0000 mg | ORAL_TABLET | Freq: Four times a day (QID) | ORAL | Status: DC | PRN

## 2016-08-22 MED ORDER — HYDROMORPHONE HCL 1 MG/ML IJ SOLN: 1.0000 mg | INTRAMUSCULAR | Status: DC | PRN

## 2016-08-22 MED ORDER — CEFAZOLIN SODIUM-DEXTROSE 1-4 GM/50ML-% IV SOLN
1.0000 g | Freq: Three times a day (TID) | INTRAVENOUS | Status: DC
  Administered 2016-08-22 – 2016-08-24 (×6): 1 g via INTRAVENOUS
  Filled 2016-08-22 (×7): qty 50
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
## 2016-08-23 ENCOUNTER — Encounter (HOSPITAL_COMMUNITY): Payer: Self-pay | Admitting: *Deleted

## 2016-08-23 LAB — URINE CULTURE: CULTURE: NO GROWTH
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
## 2016-08-23 NOTE — Progress Notes (Signed)
Patient still complains of burning and pain in left upper arm. Site where IV was removed reddened and inflamed. Looks as if there might be an tiny open pus area where IV was inserted.

## 2016-08-24 ENCOUNTER — Encounter (HOSPITAL_COMMUNITY): Payer: Self-pay | Admitting: General Practice

## 2016-08-24 LAB — CBC
HEMATOCRIT: 25.8 % — AB (ref 39.0–52.0)
HEMOGLOBIN: 8.7 g/dL — AB (ref 13.0–17.0)
MCH: 31.9 pg (ref 26.0–34.0)
MCHC: 33.7 g/dL (ref 30.0–36.0)
MCV: 94.5 fL (ref 78.0–100.0)
Platelets: 543 10*3/uL — ABNORMAL HIGH (ref 150–400)
RBC: 2.73 MIL/uL — AB (ref 4.22–5.81)
RDW: 13 % (ref 11.5–15.5)
WBC: 11.5 10*3/uL — AB (ref 4.0–10.5)

## 2016-08-24 MED ORDER — ENOXAPARIN SODIUM 40 MG/0.4ML ~~LOC~~ SOLN: 40.0000 mg | SUBCUTANEOUS | Status: DC

## 2016-08-24 MED ORDER — VANCOMYCIN HCL 10 G IV SOLR
1250.0000 mg | Freq: Two times a day (BID) | INTRAVENOUS | Status: DC
  Administered 2016-08-24 (×2): 1250 mg via INTRAVENOUS
  Filled 2016-08-24 (×3): qty 1250

## 2016-08-24 MED ORDER — ENOXAPARIN SODIUM 40 MG/0.4ML ~~LOC~~ SOLN
40.0000 mg | SUBCUTANEOUS | Status: DC
  Filled 2016-08-24: qty 0.4

## 2016-08-24 MED ORDER — IBUPROFEN 400 MG PO TABS
400.0000 mg | ORAL_TABLET | Freq: Three times a day (TID) | ORAL | Status: DC
  Administered 2016-08-24 – 2016-08-25 (×3): 400 mg via ORAL
  Filled 2016-08-24 (×3): qty 1
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
## 2016-08-25 LAB — BASIC METABOLIC PANEL
ANION GAP: 7 (ref 5–15)
BUN: 9 mg/dL (ref 6–20)
CALCIUM: 9 mg/dL (ref 8.9–10.3)
CO2: 26 mmol/L (ref 22–32)
Chloride: 104 mmol/L (ref 101–111)
Creatinine, Ser: 0.81 mg/dL (ref 0.61–1.24)
Glucose, Bld: 90 mg/dL (ref 65–99)
Potassium: 3.9 mmol/L (ref 3.5–5.1)
Sodium: 137 mmol/L (ref 135–145)

## 2016-08-25 MED ORDER — OXYCODONE HCL 5 MG PO TABS: 5.0000 mg | ORAL_TABLET | ORAL | 0 refills | Status: DC | PRN

## 2016-08-25 MED ORDER — IBUPROFEN 400 MG PO TABS: 400.0000 mg | ORAL_TABLET | Freq: Three times a day (TID) | ORAL | 0 refills | Status: DC | PRN

## 2016-08-25 MED ORDER — ACETAMINOPHEN 325 MG PO TABS: 650.0000 mg | ORAL_TABLET | Freq: Four times a day (QID) | ORAL | Status: DC | PRN

## 2016-08-25 MED ORDER — SULFAMETHOXAZOLE-TRIMETHOPRIM 800-160 MG PO TABS: 1.0000 | ORAL_TABLET | Freq: Two times a day (BID) | ORAL | 1 refills | Status: DC

## 2016-08-25 NOTE — Progress Notes (Signed)
Discharged patient to home. Verbalized understanding of all written and verbal discharge instructions to include, follow up appointments, how to take care of wounds (supplies given:4 abd pads, 1 box 4x4 gauze, 1 kerlix roll, and 1 medipore tape), and new prescriptions given. Left unit via wheelchair, accompanied by Mother and volunteer transportation

## 2016-08-25 NOTE — Progress Notes (Addendum)
Removed IV site at RFA due to patient's insistence to have it removed since he does not want the same experience with the former IV site at his LAC despite this RN explaining to patient that he has still IV antibiotic scheduled at 1000 tomorrow. Patient wanted oral antibiotic tomorrow and told this RN that he will be going home tomorrow anyway with or without MD order.
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
## 2016-08-25 NOTE — Discharge Instructions (Signed)
MIDLINE WOUND CARE: - midline dressing to be changed twice daily - supplies: sterile saline, kerlix, scissors, ABD pads, tape  - remove dressing and all packing carefully, moistening with sterile saline as needed to avoid packing/internal dressing sticking to the wound. - clean edges of skin around the wound with water/gauze, making sure there is no tape debris or leakage left on skin that could cause skin irritation or breakdown. - dampen and clean kerlix with sterile saline and pack wound from wound base to skin level, making sure to take note of any possible areas of wound tracking, tunneling and packing appropriately. Wound can be packed loosely. Trim kerlix to size if a whole kerlix is not required. - cover wound with a dry ABD pad and secure with tape.  - write the date/time on the dry dressing/tape to better track when the last dressing change occurred. - apply any skin protectant/powder recommended by clinician to protect skin/skin folds. - change dressing as needed if leakage occurs, wound gets contaminated, or patient requests to shower. - patient may shower daily with wound open and following the shower the wound should be dried and a clean dressing placed.   [facility], [place] [phone]  OPEN ABDOMINAL SURGERY: POST OP INSTRUCTIONS  Always review your discharge instruction sheet given to you by the facility where your surgery was performed.  IF YOU HAVE DISABILITY OR FAMILY LEAVE FORMS, YOU MUST BRING THEM TO THE OFFICE FOR PROCESSING.  PLEASE DO NOT GIVE THEM TO YOUR DOCTOR.  1. A prescription for pain medication may be given to you upon discharge.  Take your pain medication as prescribed, if needed.  If narcotic pain medicine is not needed, then you may take acetaminophen (Tylenol) or ibuprofen (Advil) as needed. 2. Take your usually prescribed medications unless otherwise directed. 3. If you need a refill on your pain medication, please contact your  pharmacy. They will contact our office to request authorization.  Prescriptions will not be filled after 5pm or on week-ends. 4. You should follow a light diet the first few days after arrival home, such as soup and crackers, pudding, etc.unless your doctor has advised otherwise. A high-fiber, low fat diet can be resumed as tolerated.   Be sure to include lots of fluids daily. Most patients will experience some swelling and bruising on the chest and neck area.  Ice packs will help.  Swelling and bruising can take several days to resolve 5. Most patients will experience some swelling and bruising in the area of the incision. Ice pack will help. Swelling and bruising can take several days to resolve..  6. It is common to experience some constipation if taking pain medication after surgery.  Increasing fluid intake and taking a stool softener will usually help or prevent this problem from occurring.  A mild laxative (Milk of Magnesia or Miralax) should be taken according to package directions if there are no bowel movements after 48 hours. 7.  You may have steri-strips (small skin tapes) in place directly over the incision.  These strips should be left on the skin for 7-10 days.  If your surgeon used skin glue on the incision, you may shower in 24 hours.  The glue will flake off over the next 2-3 weeks.  Any sutures or staples will be removed at the office during your follow-up visit. You may find that a light gauze bandage over your incision may keep your staples from being rubbed or pulled.  You may shower and replace the bandage daily. 8. ACTIVITIES:  You may resume regular (light) daily activities beginning the next day--such as daily self-care, walking, climbing stairs--gradually increasing activities as tolerated.  You may have sexual intercourse when it is comfortable.  Refrain from any heavy lifting or straining until approved by your doctor. a. You may drive when you no longer are taking prescription pain  medication, you can comfortably wear a seatbelt, and you can safely maneuver your car and apply brakes b. Return to Work: ___________________________________ 9. You should see your doctor in the office for a follow-up appointment approximately two weeks after your surgery.  Make sure that you call for this appointment within a day or two after you arrive home to insure a convenient appointment time. OTHER INSTRUCTIONS:  _____________________________________________________________ _____________________________________________________________  WHEN TO CALL YOUR DOCTOR: 1. Fever over 101.0 2. Inability to urinate 3. Nausea and/or vomiting 4. Extreme swelling or bruising 5. Continued bleeding from incision. 6. Increased pain, redness, or drainage from the incision. 7. Difficulty swallowing or breathing 8. Muscle cramping or spasms. 9. Numbness or tingling in hands or feet or around lips.  The clinic staff is available to answer your questions during regular business hours.  Please dont hesitate to call and ask to speak to one of the nurses if you have concerns.  For further questions, please visit [email or web address]   Phlebitis Phlebitis is soreness and puffiness (swelling) in a vein. Follow these instructions at home:  Only take medicine as told by your doctor.  Raise (elevate) the affected limb on a pillow as told by your doctor.  Keep a warm pack on the affected vein as told by your doctor. Do not sleep with a heating pad.  Use special stockings or bandages around the area of the affected vein as told by your doctor. These will speed healing and keep the condition from coming back.  Talk to your doctor about all the medicines you take.  If the phlebitis is in your legs: ? Avoid standing or resting for long periods. ? Keep your legs moving. Raise your legs when you sit or lie.  Do not smoke.  Follow-up with your doctor as told. Contact a doctor if:  You have  strange bruises or bleeding.  Your puffiness or pain in the affected area is not getting better.  You are taking medicine to lessen puffiness (anti-inflammatory medicine), and you get belly pain.  You have a fever. Get help right away if:  The phlebitis gets worse and you have more pain, puffiness (swelling), or redness.  You have trouble breathing or have chest pain. This information is not intended to replace advice given to you by your health care provider. Make sure you discuss any questions you have with your health care provider. Document Released: 02/10/2009 Document Revised: 07/31/2015 Document Reviewed: 10/30/2012 Elsevier Interactive Patient Education  2017 [employer]

## 2016-08-27 LAB — CULTURE, BLOOD (ROUTINE X 2)
CULTURE: NO GROWTH
CULTURE: NO GROWTH
SPECIAL REQUESTS: ADEQUATE
Special Requests: ADEQUATE

## 2016-09-01 ENCOUNTER — Telehealth (HOSPITAL_COMMUNITY): Payer: Self-pay

## 2016-09-01 NOTE — Telephone Encounter (Signed)
[phone] [facility].  She said he really does not need home care but does need help with wound care supplies.  Question if we could help

## 2016-09-01 NOTE — Telephone Encounter (Signed)
[facility] Patient and mother have been able to do wound care on their own and patient is not home-bound, therefore not meeting [facility]. Patient needs wound care supplies, explained that generally patients have to buy their own supplies if not receiving HH. Discussed that there [place] multiple medical supply stores [place].   Wells GuilesKelly Rayburn , [facility] 09/01/2016, 1:30 PM Pager: [phone] Mon-Fri 7:00 am-4:30 pm Sat-Sun 7:00 am-11:30 am

## 2016-09-02 ENCOUNTER — Ambulatory Visit
Admission: RE | Admit: 2016-09-02 | Discharge: 2016-09-02 | Disposition: A | Discharge status: Home / Self Care | Payer: Medicaid Other | Source: Ambulatory Visit | Attending: Physician Assistant | Admitting: Physician Assistant

## 2016-09-02 ENCOUNTER — Other Ambulatory Visit: Payer: Self-pay | Admitting: Physician Assistant

## 2016-09-02 DIAGNOSIS — J939 Pneumothorax, unspecified: Secondary | ICD-10-CM

## 2017-10-27 ENCOUNTER — Emergency Department (HOSPITAL_BASED_OUTPATIENT_CLINIC_OR_DEPARTMENT_OTHER)
Admission: EM | Admit: 2017-10-27 | Discharge: 2017-10-27 | Disposition: A | Discharge status: Home / Self Care | Payer: Self-pay | Attending: Emergency Medicine | Admitting: Emergency Medicine

## 2017-10-27 ENCOUNTER — Other Ambulatory Visit: Payer: Self-pay

## 2017-10-27 ENCOUNTER — Encounter (HOSPITAL_BASED_OUTPATIENT_CLINIC_OR_DEPARTMENT_OTHER): Payer: Self-pay | Admitting: Emergency Medicine

## 2017-10-27 DIAGNOSIS — D573 Sickle-cell trait: Secondary | ICD-10-CM | POA: Insufficient documentation

## 2017-10-27 DIAGNOSIS — Z202 Contact with and (suspected) exposure to infections with a predominantly sexual mode of transmission: Secondary | ICD-10-CM | POA: Insufficient documentation

## 2017-10-27 DIAGNOSIS — F17290 Nicotine dependence, other tobacco product, uncomplicated: Secondary | ICD-10-CM | POA: Insufficient documentation

## 2017-10-27 LAB — URINALYSIS, ROUTINE W REFLEX MICROSCOPIC
BILIRUBIN URINE: NEGATIVE
Glucose, UA: NEGATIVE mg/dL
Hgb urine dipstick: NEGATIVE
KETONES UR: NEGATIVE mg/dL
LEUKOCYTES UA: NEGATIVE
NITRITE: NEGATIVE
PROTEIN: 30 mg/dL — AB
Specific Gravity, Urine: >1.03 — ABNORMAL HIGH (ref 1.005–1.030)
pH: 5.5 (ref 5.0–8.0)

## 2017-10-27 LAB — URINALYSIS, MICROSCOPIC (REFLEX)

## 2017-10-27 MED ORDER — METRONIDAZOLE 500 MG PO TABS
2000.0000 mg | ORAL_TABLET | Freq: Once | ORAL | Status: AC
  Administered 2017-10-27: 2000 mg via ORAL
  Filled 2017-10-27: qty 4

## 2017-10-27 NOTE — ED Triage Notes (Signed)
Patient presents requesting STD check; states he may have been exposed to STD. Denies any urinary sx or penile discharge.
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
## 2017-10-28 LAB — GC/CHLAMYDIA PROBE AMP (~~LOC~~) NOT AT ARMC
CHLAMYDIA, DNA PROBE: POSITIVE — AB
NEISSERIA GONORRHEA: NEGATIVE

## 2018-12-18 ENCOUNTER — Encounter (HOSPITAL_BASED_OUTPATIENT_CLINIC_OR_DEPARTMENT_OTHER): Payer: Self-pay

## 2018-12-18 ENCOUNTER — Other Ambulatory Visit: Payer: Self-pay

## 2018-12-18 ENCOUNTER — Emergency Department (HOSPITAL_BASED_OUTPATIENT_CLINIC_OR_DEPARTMENT_OTHER)
Admission: EM | Admit: 2018-12-18 | Discharge: 2018-12-18 | Disposition: A | Discharge status: Home / Self Care | Payer: Self-pay | Attending: Emergency Medicine | Admitting: Emergency Medicine

## 2018-12-18 DIAGNOSIS — Z5321 Procedure and treatment not carried out due to patient leaving prior to being seen by health care provider: Secondary | ICD-10-CM | POA: Insufficient documentation

## 2018-12-18 NOTE — ED Triage Notes (Signed)
Pt c/o rash to groin x 1-2 months-NAD-steady gait
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
## 2018-12-18 NOTE — ED Notes (Signed)
Pt has not returned to ED 

## 2018-12-28 ENCOUNTER — Other Ambulatory Visit: Payer: Self-pay

## 2018-12-28 ENCOUNTER — Encounter (HOSPITAL_BASED_OUTPATIENT_CLINIC_OR_DEPARTMENT_OTHER): Payer: Self-pay | Admitting: *Deleted

## 2018-12-28 ENCOUNTER — Emergency Department (HOSPITAL_BASED_OUTPATIENT_CLINIC_OR_DEPARTMENT_OTHER)
Admission: EM | Admit: 2018-12-28 | Discharge: 2018-12-28 | Disposition: A | Discharge status: Home / Self Care | Payer: Self-pay | Attending: Emergency Medicine | Admitting: Emergency Medicine

## 2018-12-28 DIAGNOSIS — F17210 Nicotine dependence, cigarettes, uncomplicated: Secondary | ICD-10-CM | POA: Insufficient documentation

## 2018-12-28 DIAGNOSIS — Z880 Allergy status to penicillin: Secondary | ICD-10-CM | POA: Insufficient documentation

## 2018-12-28 DIAGNOSIS — Z91018 Allergy to other foods: Secondary | ICD-10-CM | POA: Insufficient documentation

## 2018-12-28 DIAGNOSIS — R21 Rash and other nonspecific skin eruption: Secondary | ICD-10-CM | POA: Insufficient documentation
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
## 2018-12-28 NOTE — ED Triage Notes (Signed)
Flaky skin on thigh comes and goes  Denies burning w urination  No penile discharge

## 2018-12-28 NOTE — Discharge Instructions (Signed)
APPLY POWDER SUCH AS GOLD BOND OR MONKEY BUTT TO GROIN AREA BEFORE ANY SWEATY ACTIVITIES SUCH AS EXERCISE OR WORKING IN THE HEAT. DRY OFF GROIN AREA WELL AFTER SHOWERING. CHANGE UNDERWEAR FREQUENTLY IF SWEATY.

## 2018-12-29 LAB — GC/CHLAMYDIA PROBE AMP (~~LOC~~) NOT AT ARMC
Chlamydia: NEGATIVE
Neisseria Gonorrhea: NEGATIVE

## 2019-09-15 ENCOUNTER — Emergency Department (HOSPITAL_BASED_OUTPATIENT_CLINIC_OR_DEPARTMENT_OTHER): Payer: Self-pay

## 2019-09-15 ENCOUNTER — Emergency Department (HOSPITAL_BASED_OUTPATIENT_CLINIC_OR_DEPARTMENT_OTHER)
Admission: EM | Admit: 2019-09-15 | Discharge: 2019-09-16 | Disposition: A | Discharge status: Home / Self Care | Payer: Self-pay | Attending: Emergency Medicine | Admitting: Emergency Medicine

## 2019-09-15 ENCOUNTER — Encounter (HOSPITAL_BASED_OUTPATIENT_CLINIC_OR_DEPARTMENT_OTHER): Payer: Self-pay | Admitting: Emergency Medicine

## 2019-09-15 ENCOUNTER — Other Ambulatory Visit: Payer: Self-pay

## 2019-09-15 DIAGNOSIS — Y939 Activity, unspecified: Secondary | ICD-10-CM | POA: Insufficient documentation

## 2019-09-15 DIAGNOSIS — Y999 Unspecified external cause status: Secondary | ICD-10-CM | POA: Insufficient documentation

## 2019-09-15 DIAGNOSIS — S62024A Nondisplaced fracture of middle third of navicular [scaphoid] bone of right wrist, initial encounter for closed fracture: Secondary | ICD-10-CM | POA: Insufficient documentation

## 2019-09-15 DIAGNOSIS — F17290 Nicotine dependence, other tobacco product, uncomplicated: Secondary | ICD-10-CM | POA: Insufficient documentation

## 2019-09-15 DIAGNOSIS — Y9269 Other specified industrial and construction area as the place of occurrence of the external cause: Secondary | ICD-10-CM | POA: Insufficient documentation

## 2019-09-15 DIAGNOSIS — W1830XA Fall on same level, unspecified, initial encounter: Secondary | ICD-10-CM | POA: Insufficient documentation

## 2019-09-15 DIAGNOSIS — F1290 Cannabis use, unspecified, uncomplicated: Secondary | ICD-10-CM | POA: Insufficient documentation

## 2019-09-15 DIAGNOSIS — Y990 Civilian activity done for income or pay: Secondary | ICD-10-CM | POA: Insufficient documentation
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
## 2019-09-15 NOTE — ED Triage Notes (Signed)
Patient states that he hurt his right wrist and hand on Thursday - noted right wrist and hand swelling

## 2019-09-16 MED ORDER — HYDROCODONE-ACETAMINOPHEN 5-325 MG PO TABS
1.0000 | ORAL_TABLET | Freq: Once | ORAL | Status: AC
  Administered 2019-09-16: 1 via ORAL
  Filled 2019-09-16: qty 1

## 2019-09-16 MED ORDER — HYDROCODONE-ACETAMINOPHEN 5-325 MG PO TABS: 1.0000 | ORAL_TABLET | ORAL | 0 refills | Status: DC | PRN

## 2019-09-16 NOTE — ED Notes (Signed)
Ice pack and splint instructions provided to pt. Voiced understanding.

## 2019-09-16 NOTE — ED Notes (Signed)
Pt states he has a ride home

## 2019-09-16 NOTE — Discharge Instructions (Addendum)
Keep your splint on, keep it clean and dry. No lifting, gripping, grasping with the right hand greater than paper/pencil tasks. Work as I showed you with active assisted range of motion to preserve digital motion.

## 2019-11-20 ENCOUNTER — Emergency Department (HOSPITAL_BASED_OUTPATIENT_CLINIC_OR_DEPARTMENT_OTHER): Payer: Self-pay

## 2019-11-20 ENCOUNTER — Emergency Department (HOSPITAL_BASED_OUTPATIENT_CLINIC_OR_DEPARTMENT_OTHER)
Admission: EM | Admit: 2019-11-20 | Discharge: 2019-11-20 | Disposition: A | Discharge status: Home / Self Care | Payer: Self-pay | Attending: Emergency Medicine | Admitting: Emergency Medicine

## 2019-11-20 ENCOUNTER — Other Ambulatory Visit: Payer: Self-pay

## 2019-11-20 ENCOUNTER — Encounter (HOSPITAL_BASED_OUTPATIENT_CLINIC_OR_DEPARTMENT_OTHER): Payer: Self-pay

## 2019-11-20 DIAGNOSIS — Z79899 Other long term (current) drug therapy: Secondary | ICD-10-CM | POA: Insufficient documentation

## 2019-11-20 DIAGNOSIS — F17290 Nicotine dependence, other tobacco product, uncomplicated: Secondary | ICD-10-CM | POA: Insufficient documentation

## 2019-11-20 DIAGNOSIS — L03317 Cellulitis of buttock: Secondary | ICD-10-CM | POA: Insufficient documentation

## 2019-11-20 LAB — COMPREHENSIVE METABOLIC PANEL
ALT: 17 U/L (ref 0–44)
AST: 20 U/L (ref 15–41)
Albumin: 4.2 g/dL (ref 3.5–5.0)
Alkaline Phosphatase: 67 U/L (ref 38–126)
Anion gap: 11 (ref 5–15)
BUN: 9 mg/dL (ref 6–20)
CO2: 27 mmol/L (ref 22–32)
Calcium: 9.1 mg/dL (ref 8.9–10.3)
Chloride: 98 mmol/L (ref 98–111)
Creatinine, Ser: 0.87 mg/dL (ref 0.61–1.24)
GFR calc Af Amer: >60 mL/min (ref >60)
GFR calc non Af Amer: >60 mL/min (ref >60)
Glucose, Bld: 95 mg/dL (ref 70–99)
Potassium: 3.9 mmol/L (ref 3.5–5.1)
Sodium: 136 mmol/L (ref 135–145)
Total Bilirubin: 1.5 mg/dL — ABNORMAL HIGH (ref 0.3–1.2)
Total Protein: 8.2 g/dL — ABNORMAL HIGH (ref 6.5–8.1)

## 2019-11-20 LAB — CBC WITH DIFFERENTIAL/PLATELET
Abs Immature Granulocytes: 0.12 10*3/uL — ABNORMAL HIGH (ref 0.00–0.07)
Basophils Absolute: 0.1 10*3/uL (ref 0.0–0.1)
Basophils Relative: 0 %
Eosinophils Absolute: 0.1 10*3/uL (ref 0.0–0.5)
Eosinophils Relative: 1 %
HCT: 44.9 % (ref 39.0–52.0)
Hemoglobin: 15.8 g/dL (ref 13.0–17.0)
Immature Granulocytes: 1 %
Lymphocytes Relative: 10 %
Lymphs Abs: 2 10*3/uL (ref 0.7–4.0)
MCH: 34.3 pg — ABNORMAL HIGH (ref 26.0–34.0)
MCHC: 35.2 g/dL (ref 30.0–36.0)
MCV: 97.6 fL (ref 80.0–100.0)
Monocytes Absolute: 1.5 10*3/uL — ABNORMAL HIGH (ref 0.1–1.0)
Monocytes Relative: 8 %
Neutro Abs: 15.8 10*3/uL — ABNORMAL HIGH (ref 1.7–7.7)
Neutrophils Relative %: 80 %
Platelets: 203 10*3/uL (ref 150–400)
RBC: 4.6 MIL/uL (ref 4.22–5.81)
RDW: 12.6 % (ref 11.5–15.5)
WBC: 19.7 10*3/uL — ABNORMAL HIGH (ref 4.0–10.5)
nRBC: 0 % (ref 0.0–0.2)

## 2019-11-20 MED ORDER — DOXYCYCLINE HYCLATE 100 MG PO CAPS: 100.0000 mg | ORAL_CAPSULE | Freq: Two times a day (BID) | ORAL | 0 refills | Status: AC

## 2019-11-20 MED ORDER — IOHEXOL 300 MG/ML  SOLN
100.0000 mL | Freq: Once | INTRAMUSCULAR | Status: AC | PRN
  Administered 2019-11-20: 100 mL via INTRAVENOUS

## 2019-11-20 MED ORDER — OXYCODONE-ACETAMINOPHEN 5-325 MG PO TABS
1.0000 | ORAL_TABLET | Freq: Once | ORAL | Status: AC
  Administered 2019-11-20: 1 via ORAL
  Filled 2019-11-20: qty 1

## 2019-11-20 MED ORDER — DOXYCYCLINE HYCLATE 100 MG PO TABS
100.0000 mg | ORAL_TABLET | Freq: Once | ORAL | Status: AC
  Administered 2019-11-20: 100 mg via ORAL
  Filled 2019-11-20: qty 1

## 2019-11-20 MED FILL — DOXYCYCLINE HYCLATE 100 MG: 100 | 7 days supply | Qty: 14 | Fill #0

## 2019-11-20 NOTE — ED Notes (Signed)
Patient transported to CT 

## 2019-11-20 NOTE — Discharge Instructions (Signed)
Please read instructions below. Take the antibiotic, doxycycline, every 12 hours until gone. Keep your wound clean and covered. Soak/flush your wound with warm water, multiple times per day. You can take Advil/ibuprofen every 6 hours as needed for pain.  Follow up with urgent care or the ED for wound recheck in 2-3 days.  Return to the ER for fever, worsening redness/pain, or new or worsening symptoms.
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
## 2019-11-20 NOTE — ED Triage Notes (Signed)
Pt arrives with c/o sore to thigh/buttocks on left side starting yesterday. Pt reports from yesterday to today the area has gotten much larger and painful.

## 2021-04-30 IMAGING — CT CT ABD-PELV W/ CM
2 of 4 series · 16 of 46 positions shown, 18 images · IV contrast (Omnipaque)
Comparison: None

CLINICAL DATA: Abdominal anscess and infection suspected,
perirectal abscess, LEFT thigh and buttock pain

EXAM:
CT ABDOMEN AND PELVIS WITH CONTRAST
TECHNIQUE: Multidetector CT imaging of the abdomen and pelvis was performed
using the standard protocol following bolus administration of
intravenous contrast. Sagittal and coronal MPR images reconstructed
from axial data set.
CONTRAST:  100mL OMNIPAQUE IOHEXOL 300 MG/ML SOLN IV. No oral
contrast administered.

[Series 3: axial st · axial · 0.74mm/px · z∈[-691,-236]mm · 13 of 101 slices shown, 15 images]
[im 5/101  soft-tissue]
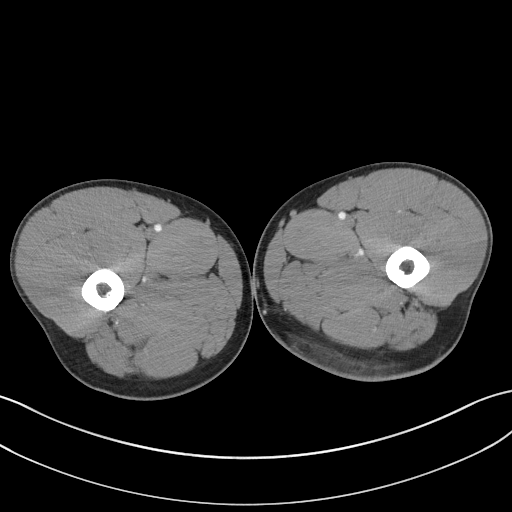
[im 5/101  bone]
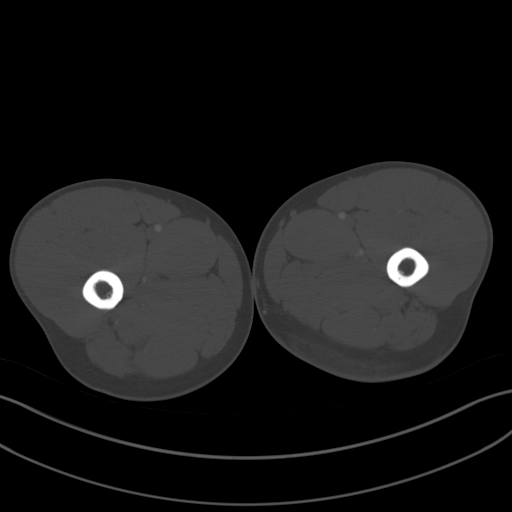
[im 13/101  soft-tissue]
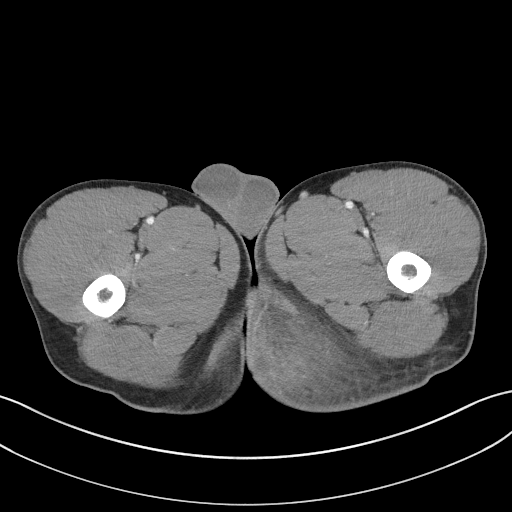
[im 21/101  soft-tissue]
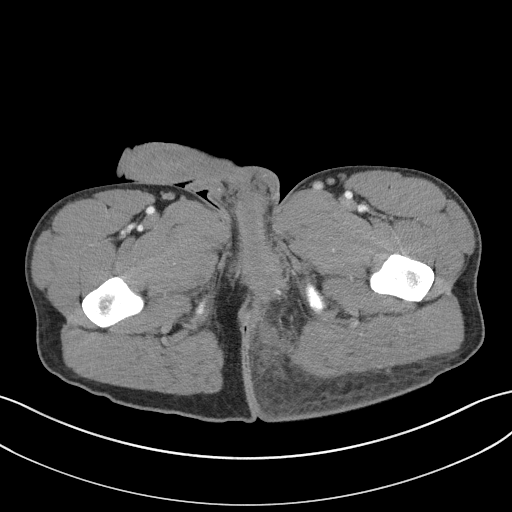
[im 30/101  soft-tissue]
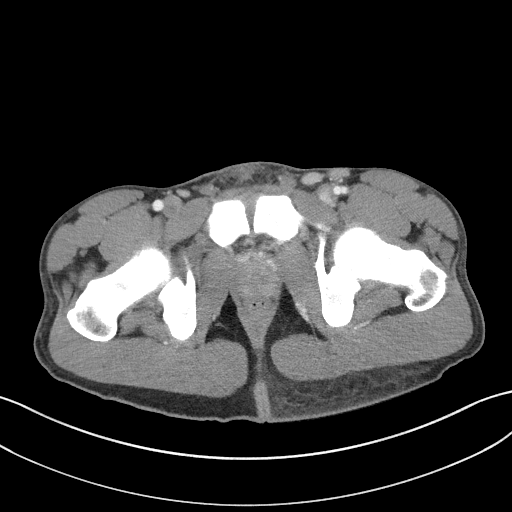
[im 34/101  soft-tissue]
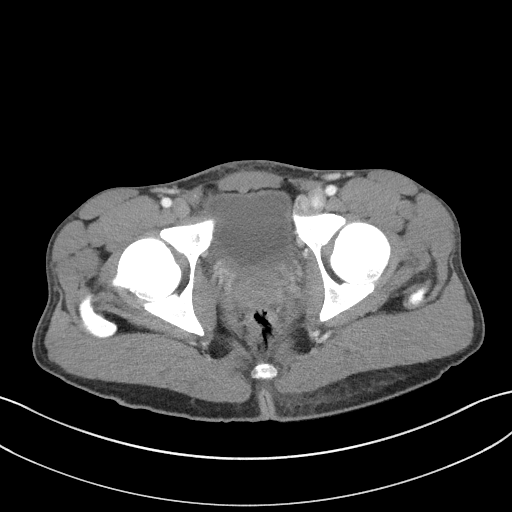
[im 42/101  soft-tissue]
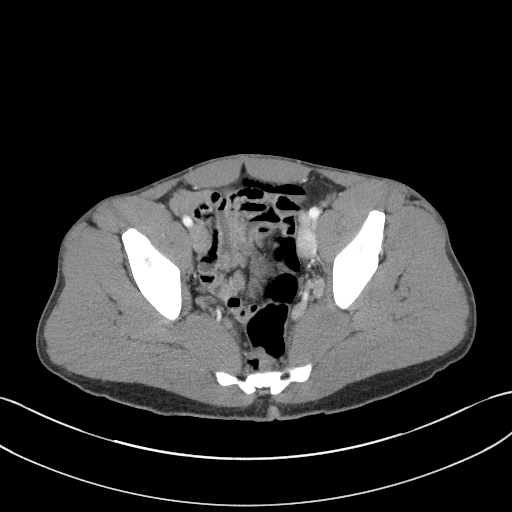
[im 51/101  soft-tissue]
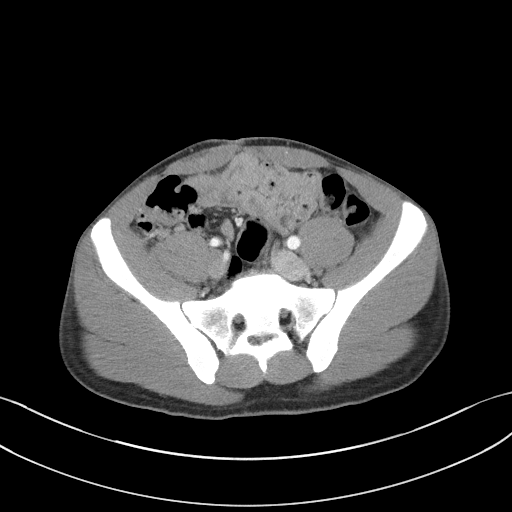
[im 59/101  soft-tissue]
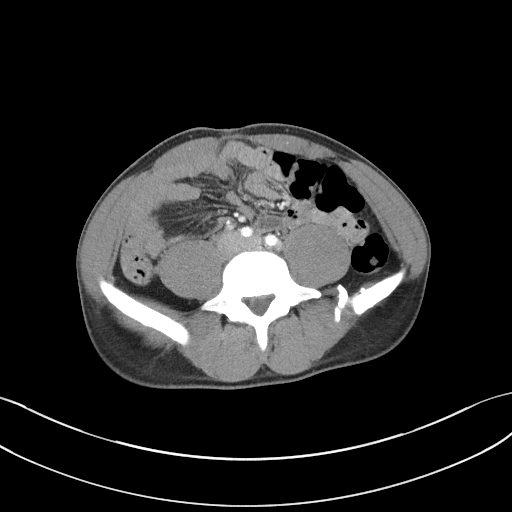
[im 67/101  soft-tissue]
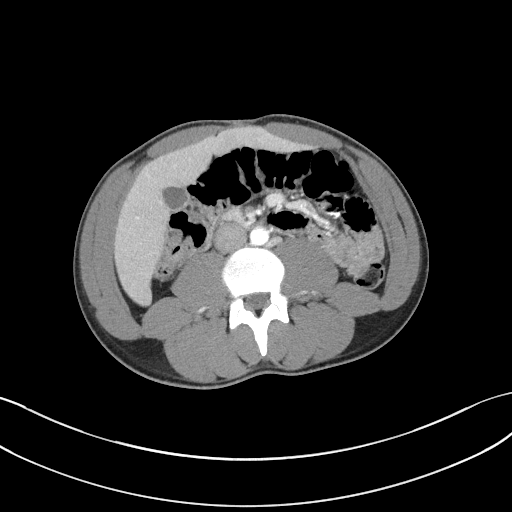
[im 67/101  bone]
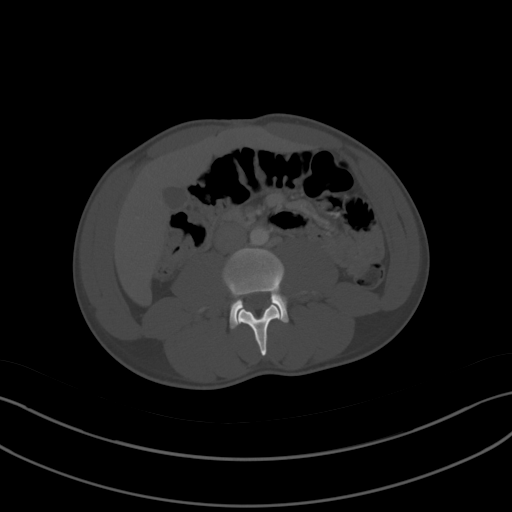
[im 71/101  soft-tissue]
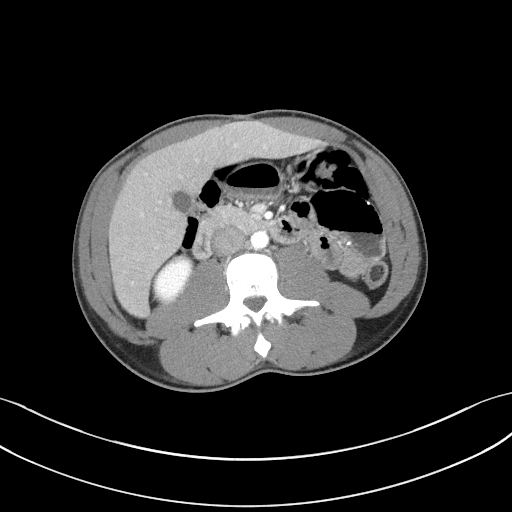
[im 80/101  soft-tissue]
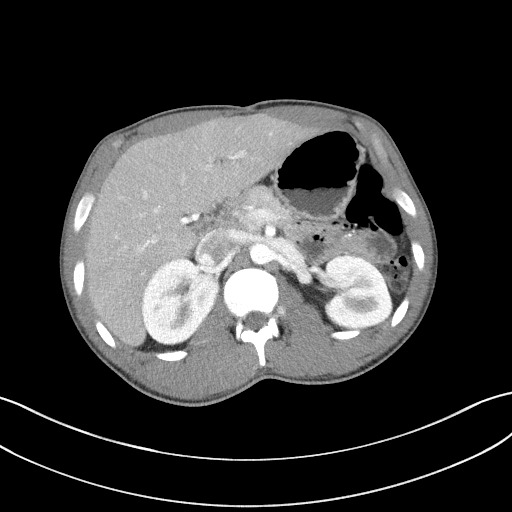
[im 88/101  soft-tissue]
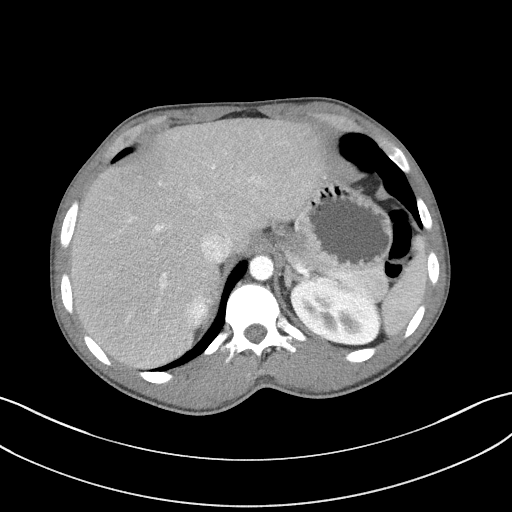
[im 96/101  soft-tissue]
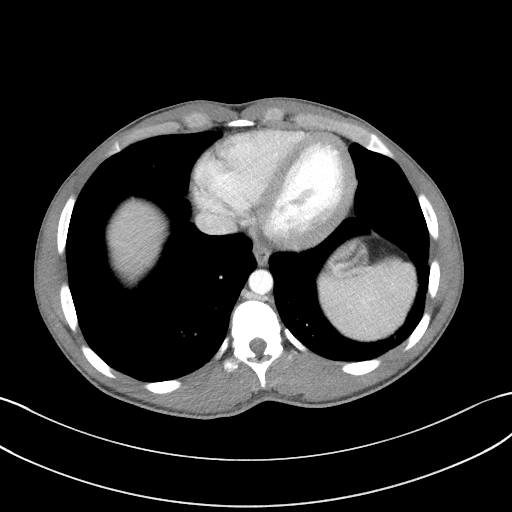

[Series 5: coronal st · coronal · 0.76mm/px · 3 of 70 slices shown]
[im 24/70  soft-tissue]
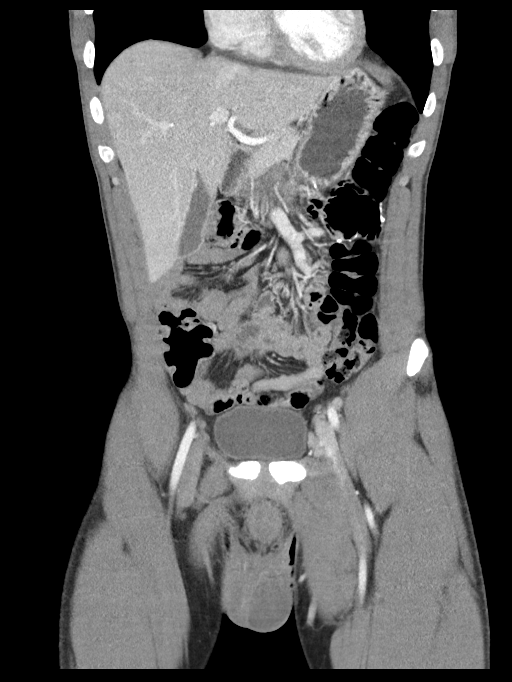
[im 31/70  soft-tissue]
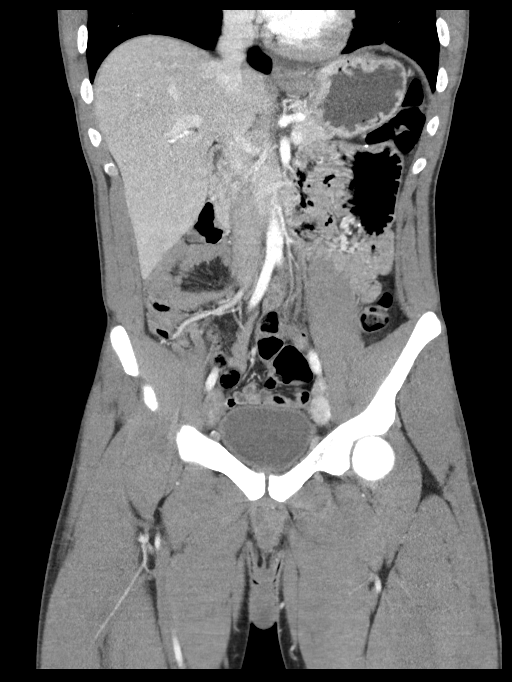
[im 39/70  soft-tissue]
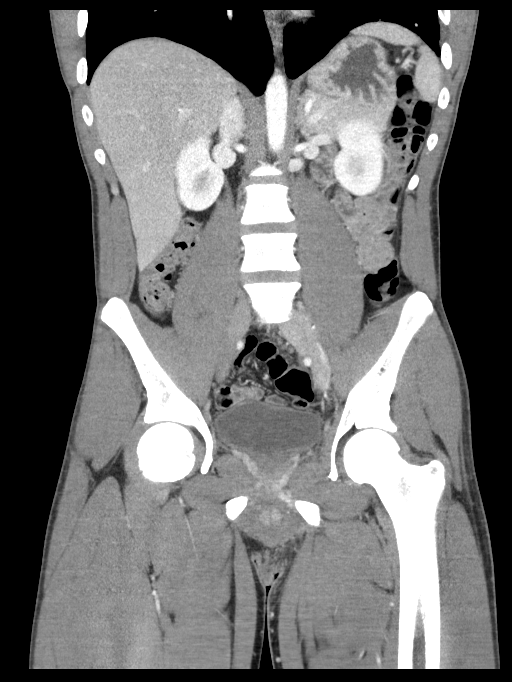

[16 of 46 positions shown; findings below may reference images not displayed]

FINDINGS: Lower chest: LEFT basilar atelectasis

Hepatobiliary: Gallbladder and liver normal appearance

Pancreas: Normal appearance

Spleen: Normal appearance

Adrenals/Urinary Tract: Adrenal glands, kidneys, ureters, and
bladder normal appearance.

Stomach/Bowel: Normal appendix. Stomach and bowel loops normal
appearance

Vascular/Lymphatic: Vascular structures patent.  No adenopathy.

Reproductive: Unremarkable

Other: No free air or free fluid. Diffuse infiltrative changes LEFT
buttock medially consistent with cellulitis. Questionable developing
focal fluid collection 2 cm diameter versus edema, not yet a
marginated abscess collection. No soft tissue gas. Soft tissue edema
extends towards the perineum but does not extend intrapelvic.

Musculoskeletal: Unremarkable
IMPRESSION: Diffuse infiltrative changes of the LEFT buttock medially consistent
with cellulitis.

Questionable developing focal fluid collection 2 cm diameter versus
edema, not yet a defined abscess collection.

No soft tissue gas identified.

Remainder of exam unremarkable.

## 2024-03-05 ENCOUNTER — Ambulatory Visit: Payer: Self-pay | Admitting: Surgery

## 2024-03-13 ENCOUNTER — Encounter (HOSPITAL_BASED_OUTPATIENT_CLINIC_OR_DEPARTMENT_OTHER): Payer: Self-pay | Admitting: Surgery

## 2024-03-13 ENCOUNTER — Other Ambulatory Visit: Payer: Self-pay

## 2024-03-19 NOTE — Anesthesia Preprocedure Evaluation (Signed)
"                                    Anesthesia Evaluation  Patient identified by MRN, date of birth, ID band Patient awake    Reviewed: Allergy & Precautions, NPO status , Patient's Chart, lab work & pertinent test results  Airway Mallampati: II  TM Distance: >3 FB Neck ROM: Full    Dental no notable dental hx. (+) Teeth Intact, Dental Advisory Given   Pulmonary neg pulmonary ROS, Current SmokerPatient did not abstain from smoking.   Pulmonary exam normal breath sounds clear to auscultation       Cardiovascular negative cardio ROS Normal cardiovascular exam Rhythm:Regular Rate:Normal     Neuro/Psych negative neurological ROS  negative psych ROS   GI/Hepatic negative GI ROS, Neg liver ROS,,,  Endo/Other  negative endocrine ROS    Renal/GU negative Renal ROS  negative genitourinary   Musculoskeletal negative musculoskeletal ROS (+)    Abdominal   Peds  Hematology negative hematology ROS (+) Hemophilia   Anesthesia Other Findings All: PCN  Reproductive/Obstetrics negative OB ROS                              Anesthesia Physical Anesthesia Plan  ASA: 2  Anesthesia Plan: General   Post-op Pain Management: Toradol  IV (intra-op)*, Tylenol  PO (pre-op)* and Precedex    Induction: Intravenous  PONV Risk Score and Plan: Treatment may vary due to age or medical condition, Midazolam , Ondansetron  and Dexamethasone   Airway Management Planned: LMA  Additional Equipment: None  Intra-op Plan:   Post-operative Plan: Extubation in OR  Informed Consent: I have reviewed the patients History and Physical, chart, labs and discussed the procedure including the risks, benefits and alternatives for the proposed anesthesia with the patient or authorized representative who has indicated his/her understanding and acceptance.     Dental advisory given  Plan Discussed with:   Anesthesia Plan Comments:          Anesthesia Quick  Evaluation  "

## 2024-03-20 ENCOUNTER — Encounter (HOSPITAL_BASED_OUTPATIENT_CLINIC_OR_DEPARTMENT_OTHER): Payer: Self-pay | Admitting: Surgery

## 2024-03-20 ENCOUNTER — Encounter (HOSPITAL_BASED_OUTPATIENT_CLINIC_OR_DEPARTMENT_OTHER): Payer: Self-pay | Admitting: Anesthesiology

## 2024-03-20 ENCOUNTER — Ambulatory Visit (HOSPITAL_BASED_OUTPATIENT_CLINIC_OR_DEPARTMENT_OTHER): Payer: Self-pay | Admitting: Anesthesiology

## 2024-03-20 ENCOUNTER — Ambulatory Visit (HOSPITAL_BASED_OUTPATIENT_CLINIC_OR_DEPARTMENT_OTHER)
Admission: RE | Admit: 2024-03-20 | Discharge: 2024-03-20 | Disposition: A | Payer: Self-pay | Attending: Surgery | Admitting: Surgery

## 2024-03-20 ENCOUNTER — Other Ambulatory Visit: Payer: Self-pay

## 2024-03-20 ENCOUNTER — Encounter (HOSPITAL_BASED_OUTPATIENT_CLINIC_OR_DEPARTMENT_OTHER): Admission: RE | Disposition: A | Payer: Self-pay | Source: Home / Self Care | Attending: Surgery

## 2024-03-20 DIAGNOSIS — K4090 Unilateral inguinal hernia, without obstruction or gangrene, not specified as recurrent: Secondary | ICD-10-CM

## 2024-03-20 DIAGNOSIS — D66 Hereditary factor VIII deficiency: Secondary | ICD-10-CM | POA: Insufficient documentation

## 2024-03-20 DIAGNOSIS — F17290 Nicotine dependence, other tobacco product, uncomplicated: Secondary | ICD-10-CM | POA: Insufficient documentation

## 2024-03-20 HISTORY — PX: INGUINAL HERNIA REPAIR: SHX194

## 2024-03-20 MED ORDER — FENTANYL CITRATE (PF) 100 MCG/2ML IJ SOLN
INTRAMUSCULAR | Status: DC | PRN
  Administered 2024-03-20: 50 ug via INTRAVENOUS
  Administered 2024-03-20: 100 ug via INTRAVENOUS
  Administered 2024-03-20 (×3): 50 ug via INTRAVENOUS

## 2024-03-20 MED ORDER — ONDANSETRON HCL 4 MG/2ML IJ SOLN
INTRAMUSCULAR | Status: AC
  Filled 2024-03-20: qty 2

## 2024-03-20 MED ORDER — DEXAMETHASONE SOD PHOSPHATE PF 10 MG/ML IJ SOLN
INTRAMUSCULAR | Status: AC
  Filled 2024-03-20: qty 1

## 2024-03-20 MED ORDER — LIDOCAINE 2% (20 MG/ML) 5 ML SYRINGE
INTRAMUSCULAR | Status: AC
  Filled 2024-03-20: qty 5

## 2024-03-20 MED ORDER — 0.9 % SODIUM CHLORIDE (POUR BTL) OPTIME
TOPICAL | Status: DC | PRN
  Administered 2024-03-20: 500 mL

## 2024-03-20 MED ORDER — KETOROLAC TROMETHAMINE 30 MG/ML IJ SOLN: 30.0000 mg | Freq: Once | INTRAMUSCULAR | Status: DC | PRN

## 2024-03-20 MED ORDER — OXYCODONE HCL 5 MG/5ML PO SOLN: 5.0000 mg | Freq: Once | ORAL | Status: AC | PRN

## 2024-03-20 MED ORDER — ROCURONIUM BROMIDE 100 MG/10ML IV SOLN
INTRAVENOUS | Status: DC | PRN
  Administered 2024-03-20: 60 mg via INTRAVENOUS

## 2024-03-20 MED ORDER — HYDROMORPHONE HCL 1 MG/ML IJ SOLN
0.2500 mg | INTRAMUSCULAR | Status: DC | PRN
  Administered 2024-03-20: 0.5 mg via INTRAVENOUS

## 2024-03-20 MED ORDER — CEFAZOLIN SODIUM-DEXTROSE 2-4 GM/100ML-% IV SOLN
2.0000 g | INTRAVENOUS | Status: AC
  Administered 2024-03-20: 2 g via INTRAVENOUS

## 2024-03-20 MED ORDER — CHLORHEXIDINE GLUCONATE CLOTH 2 % EX PADS: 6.0000 | MEDICATED_PAD | Freq: Once | CUTANEOUS | Status: DC

## 2024-03-20 MED ORDER — OXYCODONE HCL 5 MG PO TABS
ORAL_TABLET | ORAL | Status: AC
  Filled 2024-03-20: qty 1

## 2024-03-20 MED ORDER — ONDANSETRON HCL 4 MG/2ML IJ SOLN
INTRAMUSCULAR | Status: DC | PRN
  Administered 2024-03-20: 4 mg via INTRAVENOUS

## 2024-03-20 MED ORDER — ACETAMINOPHEN 500 MG PO TABS
1000.0000 mg | ORAL_TABLET | Freq: Once | ORAL | Status: AC
  Administered 2024-03-20: 1000 mg via ORAL

## 2024-03-20 MED ORDER — HYDROMORPHONE HCL 1 MG/ML IJ SOLN
INTRAMUSCULAR | Status: AC
  Filled 2024-03-20: qty 0.5

## 2024-03-20 MED ORDER — LACTATED RINGERS IV SOLN: INTRAVENOUS | Status: DC

## 2024-03-20 MED ORDER — PROPOFOL 10 MG/ML IV BOLUS
INTRAVENOUS | Status: DC | PRN
  Administered 2024-03-20: 100 mg via INTRAVENOUS
  Administered 2024-03-20: 200 mg via INTRAVENOUS

## 2024-03-20 MED ORDER — PROPOFOL 500 MG/50ML IV EMUL
INTRAVENOUS | Status: AC
  Filled 2024-03-20: qty 50

## 2024-03-20 MED ORDER — LIDOCAINE HCL (CARDIAC) PF 100 MG/5ML IV SOSY
PREFILLED_SYRINGE | INTRAVENOUS | Status: DC | PRN
  Administered 2024-03-20: 100 mg via INTRAVENOUS

## 2024-03-20 MED ORDER — ROCURONIUM BROMIDE 10 MG/ML (PF) SYRINGE
PREFILLED_SYRINGE | INTRAVENOUS | Status: AC
  Filled 2024-03-20: qty 10

## 2024-03-20 MED ORDER — KETOROLAC TROMETHAMINE 30 MG/ML IJ SOLN
INTRAMUSCULAR | Status: AC
  Filled 2024-03-20: qty 1

## 2024-03-20 MED ORDER — KETOROLAC TROMETHAMINE 30 MG/ML IJ SOLN
INTRAMUSCULAR | Status: DC | PRN
  Administered 2024-03-20: 30 mg via INTRAVENOUS

## 2024-03-20 MED ORDER — MIDAZOLAM HCL 5 MG/5ML IJ SOLN
INTRAMUSCULAR | Status: DC | PRN
  Administered 2024-03-20: 2 mg via INTRAVENOUS

## 2024-03-20 MED ORDER — CEFAZOLIN SODIUM-DEXTROSE 2-4 GM/100ML-% IV SOLN
INTRAVENOUS | Status: AC
  Filled 2024-03-20: qty 100

## 2024-03-20 MED ORDER — FENTANYL CITRATE (PF) 100 MCG/2ML IJ SOLN
INTRAMUSCULAR | Status: AC
  Filled 2024-03-20: qty 2

## 2024-03-20 MED ORDER — DEXMEDETOMIDINE HCL IN NACL 80 MCG/20ML IV SOLN
INTRAVENOUS | Status: DC | PRN
  Administered 2024-03-20 (×2): 4 ug via INTRAVENOUS
  Administered 2024-03-20: 8 ug via INTRAVENOUS

## 2024-03-20 MED ORDER — MIDAZOLAM HCL 2 MG/2ML IJ SOLN
INTRAMUSCULAR | Status: AC
  Filled 2024-03-20: qty 2

## 2024-03-20 MED ORDER — ONDANSETRON HCL 4 MG/2ML IJ SOLN: 4.0000 mg | Freq: Once | INTRAMUSCULAR | Status: DC | PRN

## 2024-03-20 MED ORDER — DEXAMETHASONE SODIUM PHOSPHATE 4 MG/ML IJ SOLN
INTRAMUSCULAR | Status: DC | PRN
  Administered 2024-03-20: 5 mg via INTRAVENOUS

## 2024-03-20 MED ORDER — BUPIVACAINE-EPINEPHRINE 0.5% -1:200000 IJ SOLN
INTRAMUSCULAR | Status: DC | PRN
  Administered 2024-03-20: 15 mL

## 2024-03-20 MED ORDER — ACETAMINOPHEN 500 MG PO TABS
ORAL_TABLET | ORAL | Status: AC
  Filled 2024-03-20: qty 2

## 2024-03-20 MED ORDER — OXYCODONE-ACETAMINOPHEN 5-325 MG PO TABS: 1.0000 | ORAL_TABLET | ORAL | 0 refills | Status: AC | PRN

## 2024-03-20 MED ORDER — SUGAMMADEX SODIUM 200 MG/2ML IV SOLN
INTRAVENOUS | Status: DC | PRN
  Administered 2024-03-20: 200 mg via INTRAVENOUS

## 2024-03-20 MED ORDER — OXYCODONE HCL 5 MG PO TABS
5.0000 mg | ORAL_TABLET | Freq: Once | ORAL | Status: AC | PRN
  Administered 2024-03-20: 5 mg via ORAL

## 2024-03-20 NOTE — Discharge Instructions (Addendum)
 "  GROIN HERNIA REPAIR POST OPERATIVE INSTRUCTIONS  Thinking Clearly  The anesthesia may cause you to feel different for 1 or 2 days. Do not drive, drink alcohol, or make any big decisions for at least 2 days.  Nutrition When you wake up, you will be able to drink small amounts of liquid. If you do not feel sick, you can slowly advance your diet to regular foods. Continue to drink lots of fluids, usually about 8 to 10 glasses per day. Eat a high-fiber diet so you dont strain during bowel movements. High-Fiber Foods Foods high in fiber include beans, bran cereals and whole-grain breads, peas, dried fruit (figs, apricots, and dates), raspberries, blackberries, strawberries, sweet corn, broccoli, baked potatoes with skin, plums, pears, apples, greens, and nuts. Activity Slowly increase your activity. Be sure to get up and walk every hour or so to prevent blood clots. No heavy lifting or strenuous activity for 4 weeks following surgery to prevent hernias at your incision sites or recurrence of your hernia. It is normal to feel tired. You may need more sleep than usual.  Get your rest but make sure to get up and move around frequently to prevent blood clots and pneumonia.  Work and Return to [employer] can go back to work when you feel well enough. Discuss the timing with your surgeon. You can usually go back to school or work 1 week or less after an laparoscopic or an open repair. If your work requires heavy lifting or strenuous activity you need to be placed on light duty for 4 weeks following surgery. You can return to gym class, sports or other physical activities 4 weeks after surgery.  Wound Care You may experience significant bruising in the groin including into the scrotum in males.  Rest, elevating the groin and scrotum above the level of the heart, ice and compression with tight fitting underwear can help.  Always wash your hands before and after touching near your incision site. Do  not soak in a bathtub until cleared at your follow up appointment. You may take a shower 24 hours after surgery. A small amount of drainage from the incision is normal. If the drainage is thick and yellow or the site is red, you may have an infection, so call your surgeon. If you have a drain in one of your incisions, it will be taken out in office when the drainage stops. Steri-Strips will fall off in 7 to 10 days or they will be removed during your first office visit. If you have dermabond glue covering over the incision, allow the glue to flake off on its own. Protect the new skin, especially from the sun. The sun can burn and cause darker scarring. Your scar will heal in about 4 to 6 weeks and will become softer and continue to fade over the next year.  The cosmetic appearance of the incisions will improve over the course of the first year after surgery. Sensation around your incision will return in a few weeks or months.  Bowel Movements After intestinal surgery, you may have loose watery stools for several days. If watery diarrhea lasts longer than 3 days, contact your surgeon. Pain medication (narcotics) can cause constipation. Increase the fiber in your diet with high-fiber foods if you are constipated. You can take an over the counter stool softener like Colace to avoid constipation.  Additional over the counter medications can also be used if Colace isn't sufficient (for example, Milk of Magnesia or Miralax).  Pain The amount of pain is different for each person. Some people need only 1 to 3 doses of pain control medication, while others need more. Take alternating doses of tylenol  and ibuprofen  around the clock for the first five days following surgery.  This will provide a baseline of pain control and help with inflammation.  Take the narcotic pain medication in addition if needed for severe pain.  Contact Your Surgeon at [phone], if you have: Pain that will not go away Pain that  gets worse A fever of more than 101F (38.3C) Repeated vomiting Swelling, redness, bleeding, or bad-smelling drainage from your wound site Strong abdominal pain No bowel movement or unable to pass gas for 3 days Watery diarrhea lasting longer than 3 days  Pain Control The goal of pain control is to minimize pain, keep you moving and help you heal. Your surgical team will work with you on your pain plan. Most often a combination of therapies and medications are used to control your pain. You may also be given medication (local anesthetic) at the surgical site. This may help control your pain for several days. Extreme pain puts extra stress on your body at a time when your body needs to focus on healing. Do not wait until your pain has reached a level 10 or is unbearable before telling your doctor or nurse. It is much easier to control pain before it becomes severe. Following a laparoscopic procedure, pain is sometimes felt in the shoulder. This is due to the gas inserted into your abdomen during the procedure. Moving and walking helps to decrease the gas and the right shoulder pain.  Use the guide below for ways to manage your post-operative pain. Learn more by going to facs.org/safepaincontrol.  How Intense Is My Pain Common Therapies to Feel Better       I hardly notice my pain, and it does not interfere with my activities.  I notice my pain and it distracts me, but I can still do activities (sitting up, walking, standing).  Non-Medication Therapies  Ice (in a bag, applied over clothing at the surgical site), elevation, rest, meditation, massage, distraction (music, TV, play) walking and mild exercise Splinting the abdomen with pillows +  Non-Opioid Medications Acetaminophen  (Tylenol ) Non-steroidal anti-inflammatory drugs (NSAIDS) Aspirin, Ibuprofen  (Motrin , Advil ) Naproxen (Aleve) Take these as needed, when you feel pain. Both acetaminophen  and NSAIDs help to decrease pain  and swelling (inflammation).      My pain is hard to ignore and is more noticeable even when I rest.  My pain interferes with my usual activities.  Non-Medication Therapies  +  Non-Opioid medications  Take on a regular schedule (around-the-clock) instead of as needed. (For example, Tylenol  every 6 hours at 9:00 am, 3:00 pm, 9:00 pm, 3:00 am and Motrin  every 6 hours at 12:00 am, 6:00 am, 12:00 pm, 6:00 pm)         I am focused on my pain, and I am not doing my daily activities.  I am groaning in pain, and I cannot sleep. I am unable to do anything.  My pain is as bad as it could be, and nothing else matters.  Non-Medication Therapies  +  Around-the-Clock Non-Opioid Medications  +  Short-acting opioids  Opioids should be used with other medications to manage severe pain. Opioids block pain and give a feeling of euphoria (feel high). Addiction, a serious side effect of opioids, is rare with short-term (a few days) use.  Examples of short-acting opioids  include: Tramadol (Ultram), Hydrocodone  (Norco, Vicodin), Hydromorphone  (Dilaudid ), Oxycodone  (Oxycontin )     The above directions have been adapted from the [employer] of [employer] Surgical Patient Education Program.  Please refer to the ACS website if needed: http://chapman.info/.ashx   William Foy, MD [facility] [address], [place], [place]  [address] ?  [address] [address], [place], [place]   [address] [phone] ? [phone] ? FAX [phone] Web site: [email or web address]   No Tylenol  before 1:10pm. No ibuprofen  before 5:30pm.   Post Anesthesia Home Care Instructions  Activity: Get plenty of rest for the remainder of the day. A responsible individual must stay with you for 24 hours following the procedure.  For the next 24 hours, DO NOT: -Drive a car -Advertising copywriter -Drink alcoholic  beverages -Take any medication unless instructed by your physician -Make any legal decisions or sign important papers.  Meals: Start with liquid foods such as gelatin or soup. Progress to regular foods as tolerated. Avoid greasy, spicy, heavy foods. If nausea and/or vomiting occur, drink only clear liquids until the nausea and/or vomiting subsides. Call your physician if vomiting continues.  Special Instructions/Symptoms: Your throat may feel dry or sore from the anesthesia or the breathing tube placed in your throat during surgery. If this causes discomfort, gargle with warm salt water. The discomfort should disappear within 24 hours.     "
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
## 2024-03-20 NOTE — Anesthesia Procedure Notes (Signed)
 Procedure Name: Intubation Date/Time: 03/20/2024 8:51 AM  Performed by: Pam Macario BROCKS, CRNAPre-anesthesia Checklist: Patient identified, Emergency Drugs available, Suction available, Patient being monitored and Timeout performed Patient Re-evaluated:Patient Re-evaluated prior to induction Oxygen Delivery Method: Circle system utilized Preoxygenation: Pre-oxygenation with 100% oxygen Induction Type: IV induction Ventilation: Mask ventilation without difficulty Laryngoscope Size: Mac and 3 Grade View: Grade II Tube type: Oral Tube size: 7.0 mm Number of attempts: 1 Airway Equipment and Method: Stylet and Oral airway Placement Confirmation: ETT inserted through vocal cords under direct vision, positive ETCO2, breath sounds checked- equal and bilateral and CO2 detector Secured at: 23 cm Tube secured with: Tape Dental Injury: Teeth and Oropharynx as per pre-operative assessment

## 2024-03-20 NOTE — Transfer of Care (Signed)
 Immediate Anesthesia Transfer of Care Note  Patient: William Harrison  Procedure(s) Performed: REPAIR, HERNIA, INGUINAL, LAPAROSCOPIC (Abdomen)  Patient Location: PACU  Anesthesia Type:General  Level of Consciousness: awake, alert , and oriented  Airway & Oxygen Therapy: Patient Spontanous Breathing and Patient connected to face mask oxygen  Post-op Assessment: Report given to RN and Post -op Vital signs reviewed and stable  Post vital signs: Reviewed and stable  Last Vitals:  Vitals Value Taken Time  BP    Temp    Pulse 62 03/20/24 10:19  Resp 13 03/20/24 10:19  SpO2 99 % 03/20/24 10:19  Vitals shown include unfiled device data.  Last Pain:  Vitals:   03/20/24 0703  TempSrc: Tympanic  PainSc: 0-No pain      Patients Stated Pain Goal: 5 (03/20/24 0703)  Complications: No notable events documented.

## 2024-03-20 NOTE — Anesthesia Postprocedure Evaluation (Signed)
"   Anesthesia Post Note  Patient: William Harrison  Procedure(s) Performed: REPAIR, HERNIA, INGUINAL, LAPAROSCOPIC (Abdomen)     Patient location during evaluation: PACU Anesthesia Type: General Level of consciousness: awake and alert Pain management: pain level controlled Vital Signs Assessment: post-procedure vital signs reviewed and stable Respiratory status: spontaneous breathing, nonlabored ventilation, respiratory function stable and patient connected to nasal cannula oxygen Cardiovascular status: blood pressure returned to baseline and stable Postop Assessment: no apparent nausea or vomiting Anesthetic complications: no   No notable events documented.  Last Vitals:  Vitals:   03/20/24 1100 03/20/24 1115  BP: 108/68 111/73  Pulse: (!) 59 60  Resp: 16 18  Temp:  (!) 36.4 C  SpO2: 94% 98%    Last Pain:  Vitals:   03/20/24 1121  TempSrc:   PainSc: 4                  Markees Carns A Keoshia Steinmetz      "
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
## 2024-03-21 ENCOUNTER — Encounter (HOSPITAL_BASED_OUTPATIENT_CLINIC_OR_DEPARTMENT_OTHER): Payer: Self-pay | Admitting: Surgery
